# Patient Record
Sex: Female | Born: 1980 | Race: Black or African American | Hispanic: No | Marital: Single | Smoking: Never smoker
Health system: Southern US, Community
[De-identification: ages and names within clinical notes are randomized; demographics above are authoritative.]

## PROBLEM LIST (undated history)

## (undated) DIAGNOSIS — F329 Major depressive disorder, single episode, unspecified: Secondary | ICD-10-CM

## (undated) DIAGNOSIS — J45909 Unspecified asthma, uncomplicated: Secondary | ICD-10-CM

## (undated) DIAGNOSIS — F32A Depression, unspecified: Secondary | ICD-10-CM

## (undated) DIAGNOSIS — T7840XA Allergy, unspecified, initial encounter: Secondary | ICD-10-CM

## (undated) DIAGNOSIS — R519 Headache, unspecified: Secondary | ICD-10-CM

## (undated) DIAGNOSIS — R87629 Unspecified abnormal cytological findings in specimens from vagina: Secondary | ICD-10-CM

## (undated) HISTORY — DX: Headache, unspecified: R51.9

## (undated) HISTORY — DX: Depression, unspecified: F32.A

## (undated) HISTORY — PX: BUNIONECTOMY: SHX129

## (undated) HISTORY — DX: Unspecified abnormal cytological findings in specimens from vagina: R87.629

## (undated) HISTORY — DX: Allergy, unspecified, initial encounter: T78.40XA

---
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
## 2004-09-12 ENCOUNTER — Emergency Department (HOSPITAL_COMMUNITY): Admission: EM | Admit: 2004-09-12 | Discharge: 2004-09-12 | Payer: Self-pay | Admitting: Emergency Medicine
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
## 2009-05-13 ENCOUNTER — Ambulatory Visit: Payer: Self-pay | Admitting: Podiatry

## 2009-12-21 HISTORY — PX: COLPOSCOPY: SHX161

## 2012-12-01 ENCOUNTER — Ambulatory Visit: Payer: Self-pay | Admitting: Podiatry

## 2013-05-11 ENCOUNTER — Ambulatory Visit: Payer: Self-pay | Admitting: Podiatry

## 2013-06-28 LAB — LIPID PANEL
Cholesterol: 190 mg/dL (ref 0–200)
HDL: 62 mg/dL (ref 35–70)
LDL CALC: 108 mg/dL
Triglycerides: 101 mg/dL (ref 40–160)

## 2013-06-28 LAB — HM PAP SMEAR: HM Pap smear: NORMAL

## 2014-07-04 LAB — TSH: TSH: 2.07 u[IU]/mL (ref <=5.90)

## 2014-07-04 LAB — BASIC METABOLIC PANEL
BUN: 9 mg/dL (ref 4–21)
Creatinine: 0.8 mg/dL (ref <=1.1)

## 2015-05-27 DIAGNOSIS — F324 Major depressive disorder, single episode, in partial remission: Secondary | ICD-10-CM | POA: Insufficient documentation

## 2015-05-27 DIAGNOSIS — T7840XA Allergy, unspecified, initial encounter: Secondary | ICD-10-CM | POA: Insufficient documentation

## 2015-05-27 DIAGNOSIS — E559 Vitamin D deficiency, unspecified: Secondary | ICD-10-CM | POA: Insufficient documentation

## 2015-05-27 DIAGNOSIS — M62830 Muscle spasm of back: Secondary | ICD-10-CM | POA: Insufficient documentation

## 2015-05-27 DIAGNOSIS — Z8742 Personal history of other diseases of the female genital tract: Secondary | ICD-10-CM | POA: Insufficient documentation

## 2015-05-27 DIAGNOSIS — J3089 Other allergic rhinitis: Secondary | ICD-10-CM | POA: Insufficient documentation

## 2015-05-27 DIAGNOSIS — J4520 Mild intermittent asthma, uncomplicated: Secondary | ICD-10-CM | POA: Insufficient documentation

## 2015-05-27 DIAGNOSIS — T783XXA Angioneurotic edema, initial encounter: Secondary | ICD-10-CM | POA: Insufficient documentation

## 2015-08-06 ENCOUNTER — Ambulatory Visit (INDEPENDENT_AMBULATORY_CARE_PROVIDER_SITE_OTHER): Payer: Medicaid Other | Admitting: Family Medicine

## 2015-08-06 ENCOUNTER — Encounter: Payer: Self-pay | Admitting: Family Medicine

## 2015-08-06 VITALS — BP 110/70 | HR 70 | Ht 67.0 in | Wt 212.0 lb

## 2015-08-06 DIAGNOSIS — R319 Hematuria, unspecified: Secondary | ICD-10-CM

## 2015-08-06 DIAGNOSIS — L810 Postinflammatory hyperpigmentation: Secondary | ICD-10-CM

## 2015-08-06 LAB — POCT URINALYSIS DIPSTICK
Bilirubin, UA: NEGATIVE
Glucose, UA: NEGATIVE
Ketones, UA: NEGATIVE
NITRITE UA: NEGATIVE
PROTEIN UA: NEGATIVE
SPEC GRAV UA: 1.01
UROBILINOGEN UA: 0.2
pH, UA: 6

## 2015-08-06 MED ORDER — TRIAMCINOLONE ACETONIDE 0.5 % EX OINT: 1.0000 "application " | TOPICAL_OINTMENT | Freq: Two times a day (BID) | CUTANEOUS | Status: DC

## 2015-08-06 MED ORDER — SULFAMETHOXAZOLE-TRIMETHOPRIM 800-160 MG PO TABS: 1.0000 | ORAL_TABLET | Freq: Two times a day (BID) | ORAL | Status: DC

## 2015-08-06 NOTE — Progress Notes (Signed)
Name: Beverly Thomas   MRN: [id number]    DOB: 1981-11-07   Date:08/06/2015       Progress Note  Subjective  Chief Complaint  Chief Complaint  Patient presents with  . Rash    started off red and itchy- got darker but not itching anymore  . Vaginal Itching    bleeding when wipes- Diflucan x 2- tried otc     Rash This is a new problem. The current episode started in the past 7 days. The problem has been gradually worsening since onset. The affected locations include the neck. The rash is characterized by itchiness (hyperpigmented). It is unknown if there was an exposure to a precipitant. Pertinent negatives include no anorexia, congestion, cough, diarrhea, eye pain, facial edema, fatigue, fever, joint pain, nail changes, rhinorrhea, shortness of breath, sore throat or vomiting. Past treatments include topical steroids (antifungal). The treatment provided no relief. There is no history of allergies, asthma, eczema or varicella.  Vaginal Itching The patient's primary symptoms include genital itching and a genital rash. The patient's pertinent negatives include no genital lesions, genital odor, missed menses, pelvic pain, vaginal bleeding or vaginal discharge. This is a recurrent problem. The problem occurs daily. The pain is mild (irratated). Associated symptoms include hematuria and rash. Pertinent negatives include no abdominal pain, anorexia, back pain, chills, constipation, diarrhea, dysuria, fever, flank pain, frequency, headaches, joint pain, nausea, painful intercourse, sore throat, urgency or vomiting. The symptoms are aggravated by tactile pressure. She has tried antifungals for the symptoms. The treatment provided mild relief.    No problem-specific assessment & plan notes found for this encounter.   Past Medical History  Diagnosis Date  . Allergy     Past Surgical History  Procedure Laterality Date  . Bunionectomy Bilateral 2013, 2014  . Colposcopy  2011    Family History   Problem Relation Age of Onset  . Diabetes Mother   . Hypertension Mother     Social History   Social History  . Marital Status: Single    Spouse Name: N/A  . Number of Children: N/A  . Years of Education: N/A   Occupational History  . Not on file.   Social History Main Topics  . Smoking status: Never Smoker   . Smokeless tobacco: Not on file  . Alcohol Use: 0.0 oz/week    0 Standard drinks or equivalent per week     Comment: social  . Drug Use: No  . Sexual Activity: Not Currently   Other Topics Concern  . Not on file   Social History Narrative    Allergies  Allergen Reactions  . Diazepam     Other reaction(s): pruritis  . Metronidazole     Other reaction(s): Angioedema  . Zyrtec  [Cetirizine]     Other reaction(s): Hives  . Latex Rash     Review of Systems  Constitutional: Negative for fever, chills, weight loss, malaise/fatigue and fatigue.  HENT: Negative for congestion, ear discharge, ear pain, rhinorrhea and sore throat.   Eyes: Negative for blurred vision and pain.  Respiratory: Negative for cough, sputum production, shortness of breath and wheezing.   Cardiovascular: Negative for chest pain, palpitations and leg swelling.  Gastrointestinal: Negative for heartburn, nausea, vomiting, abdominal pain, diarrhea, constipation, blood in stool, melena and anorexia.  Genitourinary: Positive for hematuria. Negative for dysuria, urgency, frequency, flank pain, vaginal discharge, pelvic pain and missed menses.  Musculoskeletal: Negative for myalgias, back pain, joint pain and neck pain.  Skin: Positive  for itching and rash. Negative for nail changes.       Along skin neck hyperpigmentation  Neurological: Negative for dizziness, tingling, sensory change, focal weakness and headaches.  Endo/Heme/Allergies: Negative for environmental allergies and polydipsia. Does not bruise/bleed easily.  Psychiatric/Behavioral: Negative for depression and suicidal ideas. The  patient is not nervous/anxious and does not have insomnia.      Objective  Filed Vitals:   08/06/15 1514  BP: 110/70  Pulse: 70  Height: 5\' 7"  (1.702 m)  Weight: 212 lb (96.163 kg)    Physical Exam  Constitutional: She is well-developed, well-nourished, and in no distress. No distress.  HENT:  Head: Normocephalic and atraumatic.  Right Ear: External ear normal.  Left Ear: External ear normal.  Nose: Nose normal.  Mouth/Throat: Oropharynx is clear and moist.  Eyes: Conjunctivae and EOM are normal. Pupils are equal, round, and reactive to light. Right eye exhibits no discharge. Left eye exhibits no discharge.  Neck: Normal range of motion. Neck supple. No JVD present. No thyromegaly present.  Cardiovascular: Normal rate, regular rhythm, normal heart sounds and intact distal pulses.  Exam reveals no gallop and no friction rub.   No murmur heard. Pulmonary/Chest: Effort normal and breath sounds normal.  Abdominal: Soft. Bowel sounds are normal. She exhibits no mass. There is no tenderness. There is no guarding.  Musculoskeletal: Normal range of motion. She exhibits no edema.  Lymphadenopathy:    She has no cervical adenopathy.  Neurological: She is alert. She has normal reflexes.  Skin: Skin is warm and dry. Rash noted. She is not diaphoretic. No erythema. No pallor.  Hyperpigmented ares  Psychiatric: Mood and affect normal.      Assessment & Plan  Problem List Items Addressed This Visit    None    Visit Diagnoses    Hematuria    -  Primary    Relevant Medications    sulfamethoxazole-trimethoprim (BACTRIM DS,SEPTRA DS) 800-160 MG per tablet    Other Relevant Orders    POCT Urinalysis Dipstick (Completed)    Post-inflammatory hyperpigmentation        Relevant Medications    triamcinolone ointment (KENALOG) 0.5 %    Other Relevant Orders    Ambulatory referral to Dermatology         Dr. Elizabeth Sauer [facility]  08/06/2015

## 2015-08-08 DIAGNOSIS — J40 Bronchitis, not specified as acute or chronic: Secondary | ICD-10-CM | POA: Insufficient documentation

## 2015-08-13 ENCOUNTER — Other Ambulatory Visit: Payer: Self-pay | Admitting: Internal Medicine

## 2015-08-13 MED ORDER — CLINDAMYCIN HCL 300 MG PO CAPS: 300.0000 mg | ORAL_CAPSULE | Freq: Two times a day (BID) | ORAL | Status: DC

## 2015-09-16 ENCOUNTER — Other Ambulatory Visit: Payer: Self-pay | Admitting: Internal Medicine

## 2015-10-02 ENCOUNTER — Encounter: Payer: Self-pay | Admitting: Internal Medicine

## 2015-10-16 ENCOUNTER — Encounter: Payer: Self-pay | Admitting: Internal Medicine

## 2015-10-16 ENCOUNTER — Ambulatory Visit (INDEPENDENT_AMBULATORY_CARE_PROVIDER_SITE_OTHER): Payer: Medicaid Other | Admitting: Internal Medicine

## 2015-10-16 VITALS — HR 76 | Ht 67.0 in | Wt 212.0 lb

## 2015-10-16 DIAGNOSIS — N760 Acute vaginitis: Secondary | ICD-10-CM

## 2015-10-16 LAB — POCT WET PREP WITH KOH
KOH PREP POC: NEGATIVE
RBC Wet Prep HPF POC: 2
TRICHOMONAS UA: POSITIVE
WBC WET PREP PER HPF POC: 5
Yeast Wet Prep HPF POC: 0

## 2015-10-16 MED ORDER — TINIDAZOLE 500 MG PO TABS: 2.0000 g | ORAL_TABLET | Freq: Every day | ORAL | Status: DC

## 2015-10-16 MED ORDER — CLINDAMYCIN PHOSPHATE (1 DOSE) 2 % VA CREA: 1.0000 | TOPICAL_CREAM | Freq: Every day | VAGINAL | Status: DC

## 2015-10-16 NOTE — Progress Notes (Signed)
Date:  10/16/2015   Name:  Beverly Thomas   DOB:  08/23/1981   MRN:  [id number]   Chief Complaint: Vaginal Discharge  Patient complains of vaginal discharge with some odor and slight itching. She denies any bleeding pelvic pain or stomach cramping. No fever or chills. No change in personal care products. No new soaps or bath products. She was seen several months ago with a vaginitis that resolved after treatment.   Review of Systems  Constitutional: Negative for fever and diaphoresis.  Respiratory: Negative for shortness of breath.   Cardiovascular: Negative for chest pain.  Gastrointestinal: Negative for abdominal pain and diarrhea.  Genitourinary: Positive for vaginal discharge (and odor). Negative for frequency, hematuria, flank pain and pelvic pain.    Patient Active Problem List   Diagnosis Date Noted  . Bronchitis 08/08/2015  . H/O abnormal cervical Papanicolaou smear 05/27/2015  . Angio-edema 05/27/2015  . Allergic state 05/27/2015  . 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 Asthma, mild intermittent 05/27/2015  . Back muscle spasm 05/27/2015  . Avitaminosis D 05/27/2015    Prior to Admission medications   Medication Sig Start Date End Date Taking? Authorizing Provider  cyclobenzaprine (FLEXERIL) 10 MG tablet TAKE 1/2 TO 1 TABLET TWICE A DAY 09/16/15  Yes Reubin MilanLaura H Harla Mensch, MD  DULoxetine (CYMBALTA) 30 MG capsule Take 30 mg by mouth daily.   Yes Historical Provider, MD  fluticasone (FLONASE) 50 MCG/ACT nasal spray Place 2 sprays into the nose daily. 04/13/14  Yes Historical Provider, MD  levonorgestrel (MIRENA) 20 MCG/24HR IUD by Intrauterine route.   Yes Historical Provider, MD  loratadine (CLARITIN) 10 MG tablet Take 1 tablet by mouth daily. 08/22/14  Yes Historical Provider, MD  triamcinolone ointment (KENALOG) 0.5 % Apply 1 application topically 2 (two) times daily. 08/06/15  Yes Duanne Limerickeanna C Jones, MD    Allergies  Allergen Reactions  .  Diazepam     Other reaction(s): pruritis  . Metronidazole     Other reaction(s): Angioedema  . Sulfa Antibiotics Hives  . Zyrtec  [Cetirizine]     Other reaction(s): Hives  . Latex Rash    Past Surgical History  Procedure Laterality Date  . Bunionectomy Bilateral 2013, 2014  . Colposcopy  2011    Social History  Substance Use Topics  . Smoking status: Never Smoker   . Smokeless tobacco: None  . Alcohol Use: 0.0 oz/week    0 Standard drinks or equivalent per week     Comment: social     Medication list has been reviewed and updated.   Physical Exam  Constitutional: She is oriented to person, place, and time. She appears well-developed. No distress.  HENT:  Head: Normocephalic and atraumatic.  Eyes: Conjunctivae are normal. Right eye exhibits no discharge. Left eye exhibits no discharge. No scleral icterus.  Cardiovascular: Normal rate, regular rhythm and normal heart sounds.   Pulmonary/Chest: Effort normal and breath sounds normal. No respiratory distress.  Abdominal: Soft. Normal appearance and bowel sounds are normal. There is no CVA tenderness.  Genitourinary: There is no tenderness or lesion on the right labia. There is no tenderness or lesion on the left labia. No erythema, tenderness or bleeding in the vagina. Vaginal discharge found.  Musculoskeletal: Normal range of motion.  Neurological: She is alert and oriented to person, place, and time.  Skin: Skin is warm and dry. No rash noted.  Psychiatric: She has a normal mood and affect. Her behavior is normal. Thought content  normal.  Nursing note and vitals reviewed.   Pulse 76  Ht  (1.702 m)  Wt 212 lb (96.163 kg)  BMI 33.20 kg/m2  Assessment and Plan: 1. Vaginitis Patient has both bacterial vaginosis and Trichomonas She is allergic to metronidazole so we will need to use an alternative oral agent as well as vaginal cream Sexual partner should also seek treatment - tinidazole (TINDAMAX) 500 MG tablet;  Take 4 tablets (2,000 mg total) by mouth daily with breakfast.  Dispense: 4 tablet; Refill: 0 - Clindamycin Phosphate, 1 Dose, vaginal cream; Apply 1 Applicatorful topically at bedtime.  Dispense: 5.8 g; Refill: 0 - POCT Wet Prep with KOH   Bari Edward, MD [facility]  10/16/2015

## 2015-10-22 ENCOUNTER — Other Ambulatory Visit: Payer: Self-pay | Admitting: Internal Medicine

## 2015-12-02 ENCOUNTER — Ambulatory Visit (INDEPENDENT_AMBULATORY_CARE_PROVIDER_SITE_OTHER): Payer: Medicaid Other | Admitting: Internal Medicine

## 2015-12-02 ENCOUNTER — Encounter: Payer: Self-pay | Admitting: Internal Medicine

## 2015-12-02 VITALS — BP 110/80 | HR 88 | Temp 99.0°F | Ht 67.0 in | Wt 211.4 lb

## 2015-12-02 DIAGNOSIS — J0100 Acute maxillary sinusitis, unspecified: Secondary | ICD-10-CM

## 2015-12-02 DIAGNOSIS — J40 Bronchitis, not specified as acute or chronic: Secondary | ICD-10-CM

## 2015-12-02 MED ORDER — AMOXICILLIN-POT CLAVULANATE 875-125 MG PO TABS: 1.0000 | ORAL_TABLET | Freq: Two times a day (BID) | ORAL | Status: DC

## 2015-12-02 NOTE — Progress Notes (Signed)
Date:  12/02/2015   Name:  Beverly Thomas   DOB:  March 17, 1981   MRN:  [id number]   Chief Complaint: Sinusitis Sinusitis This is a new problem. The current episode started in the past 7 days. The problem has been gradually worsening since onset. There has been no fever. Associated symptoms include congestion, coughing, shortness of breath and a sore throat. Pertinent negatives include no chills or sinus pressure. Past treatments include oral decongestants. The treatment provided mild relief.     Review of Systems  Constitutional: Negative for fever, chills and fatigue.  HENT: Positive for congestion and sore throat. Negative for postnasal drip and sinus pressure.   Respiratory: Positive for cough, chest tightness, shortness of breath and wheezing.   Cardiovascular: Negative for chest pain, palpitations and leg swelling.  Neurological: Negative for dizziness and light-headedness.    Patient Active Problem List   Diagnosis Date Noted  . Bronchitis 08/08/2015  . H/O abnormal cervical Papanicolaou smear 05/27/2015  . Angio-edema 05/27/2015  . Allergic state 05/27/2015  . 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 Asthma, mild intermittent 05/27/2015  . Back muscle spasm 05/27/2015  . Avitaminosis D 05/27/2015    Prior to Admission medications   Medication Sig Start Date End Date Taking? Authorizing Provider  cyclobenzaprine (FLEXERIL) 10 MG tablet TAKE 1/2 TO 1 TABLET TWICE A DAY 10/22/15  Yes Reubin Milan, MD  DULoxetine (CYMBALTA) 30 MG capsule Take 30 mg by mouth daily.   Yes Historical Provider, MD  fluticasone (FLONASE) 50 MCG/ACT nasal spray Place 2 sprays into the nose daily. 04/13/14  Yes Historical Provider, MD  levonorgestrel (MIRENA) 20 MCG/24HR IUD by Intrauterine route.   Yes Historical Provider, MD  loratadine (CLARITIN) 10 MG tablet Take 1 tablet by mouth daily. 08/22/14  Yes Historical Provider, MD  triamcinolone ointment (KENALOG) 0.5 %  Apply 1 application topically 2 (two) times daily. 08/06/15  Yes Duanne Limerick, MD    Allergies  Allergen Reactions  . Diazepam     Other reaction(s): pruritis  . Metronidazole     Other reaction(s): Angioedema  . Sulfa Antibiotics Hives  . Zyrtec  [Cetirizine]     Other reaction(s): Hives  . Latex Rash    Past Surgical History  Procedure Laterality Date  . Bunionectomy Bilateral 2013, 2014  . Colposcopy  2011    Social History  Substance Use Topics  . Smoking status: Never Smoker   . Smokeless tobacco: None  . Alcohol Use: 0.0 oz/week    0 Standard drinks or equivalent per week     Comment: social    Medication list has been reviewed and updated.   Physical Exam  Constitutional: She is oriented to person, place, and time. She appears well-developed and well-nourished.  HENT:  Right Ear: External ear and ear canal normal. Tympanic membrane is not erythematous and not retracted.  Left Ear: External ear and ear canal normal. Tympanic membrane is not erythematous and not retracted.  Nose: Right sinus exhibits maxillary sinus tenderness and frontal sinus tenderness. Left sinus exhibits maxillary sinus tenderness and frontal sinus tenderness.  Mouth/Throat: Uvula is midline and mucous membranes are normal. No oral lesions. Posterior oropharyngeal erythema present. No oropharyngeal exudate.  Cardiovascular: Normal rate, regular rhythm and normal heart sounds.   Pulmonary/Chest: Effort normal. She has wheezes. She has no rales.  Lymphadenopathy:    She has no cervical adenopathy.  Neurological: She is alert and oriented to person, place, and time.  Nursing note and vitals reviewed.   BP 110/80 mmHg  Pulse 88  Temp(Src) 99 F (37.2 C)  Ht 5\' 7"  (1.702 m)  Wt 211 lb 6.4 oz (95.89 kg)  BMI 33.10 kg/m2  SpO2 99%  Assessment and Plan: 1. Bronchitis Continue over-the-counter cough syrup Use albuterol inhaler as needed for wheezing - amoxicillin-clavulanate (AUGMENTIN)  875-125 MG tablet; Take 1 tablet by mouth 2 (two) times daily.  Dispense: 20 tablet; Refill: 0  2. Acute maxillary sinusitis, recurrence not specified - amoxicillin-clavulanate (AUGMENTIN) 875-125 MG tablet; Take 1 tablet by mouth 2 (two) times daily.  Dispense: 20 tablet; Refill: 0   Bari EdwardLaura Berglund, MD [facility]  12/02/2015

## 2015-12-12 ENCOUNTER — Telehealth: Payer: Self-pay

## 2015-12-12 ENCOUNTER — Other Ambulatory Visit: Payer: Self-pay | Admitting: Internal Medicine

## 2015-12-12 MED ORDER — FLUCONAZOLE 100 MG PO TABS
100.0000 mg | ORAL_TABLET | Freq: Every day | ORAL | Status: DC
Start: 2015-12-12 — End: 2015-12-18

## 2015-12-12 NOTE — Telephone Encounter (Signed)
Rx sent to pharmacy   

## 2015-12-12 NOTE — Telephone Encounter (Signed)
Patient now has yeast from antibiotic you gave her, requesting Diflucan to pharmacy.dr

## 2015-12-13 ENCOUNTER — Other Ambulatory Visit: Payer: Self-pay | Admitting: Internal Medicine

## 2015-12-18 ENCOUNTER — Encounter: Payer: Self-pay | Admitting: Internal Medicine

## 2015-12-18 ENCOUNTER — Ambulatory Visit (INDEPENDENT_AMBULATORY_CARE_PROVIDER_SITE_OTHER): Payer: Medicaid Other | Admitting: Internal Medicine

## 2015-12-18 VITALS — BP 116/68 | HR 104 | Temp 99.0°F | Ht 67.5 in | Wt 217.0 lb

## 2015-12-18 DIAGNOSIS — J40 Bronchitis, not specified as acute or chronic: Secondary | ICD-10-CM | POA: Diagnosis not present

## 2015-12-18 MED ORDER — DOXYCYCLINE HYCLATE 100 MG PO TABS: 100.0000 mg | ORAL_TABLET | Freq: Two times a day (BID) | ORAL | Status: DC

## 2015-12-18 MED ORDER — METHYLPREDNISOLONE 4 MG PO TBPK: ORAL_TABLET | ORAL | Status: DC

## 2015-12-18 MED ORDER — BENZONATATE 200 MG PO CAPS: 200.0000 mg | ORAL_CAPSULE | Freq: Two times a day (BID) | ORAL | Status: DC | PRN

## 2015-12-18 NOTE — Progress Notes (Signed)
Date:  12/18/2015   Name:  Beverly Thomas   DOB:  03/27/1981   MRN:  [id number]   Chief Complaint: Bronchitis Cough This is a recurrent problem. The current episode started in the past 7 days. The problem has been gradually worsening. The problem occurs every few minutes. Associated symptoms include shortness of breath and wheezing. Pertinent negatives include no chest pain, chills, fever or sore throat.   She was treated for bronchitis about 3 weeks ago with Augmentin. She began to feel better but then several days ago cough started up again. She's had wheezing and shortness of breath, using her pro-air inhaler every 3 hours. As a result she is having a racing heart as well.     Review of Systems  Constitutional: Positive for fatigue. Negative for fever and chills.  HENT: Negative for congestion and sore throat.   Respiratory: Positive for cough, chest tightness, shortness of breath and wheezing.   Cardiovascular: Negative for chest pain, palpitations and leg swelling.  Neurological: Negative for dizziness and syncope.    Patient Active Problem List   Diagnosis Date Noted  . Bronchitis 08/08/2015  . H/O abnormal cervical Papanicolaou smear 05/27/2015  . Angio-edema 05/27/2015  . Allergic state 05/27/2015  . 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 Asthma, mild intermittent 05/27/2015  . Back muscle spasm 05/27/2015  . Avitaminosis D 05/27/2015    Prior to Admission medications   Medication Sig Start Date End Date Taking? Authorizing Provider  cyclobenzaprine (FLEXERIL) 10 MG tablet TAKE 1/2 TO 1 TABLET TWICE A DAY 10/22/15  Yes Reubin MilanLaura H Santiago Graf, MD  DULoxetine (CYMBALTA) 30 MG capsule Take 30 mg by mouth daily.   Yes Historical Provider, MD  fluticasone (FLONASE) 50 MCG/ACT nasal spray Place 2 sprays into the nose daily. 04/13/14  Yes Historical Provider, MD  levonorgestrel (MIRENA) 20 MCG/24HR IUD by Intrauterine route.   Yes Historical  Provider, MD  loratadine (CLARITIN) 10 MG tablet Take 1 tablet by mouth daily. 08/22/14  Yes Historical Provider, MD  triamcinolone ointment (KENALOG) 0.5 % Apply 1 application topically 2 (two) times daily. 08/06/15  Yes Duanne Limerickeanna C Jones, MD    Allergies  Allergen Reactions  . Diazepam     Other reaction(s): pruritis  . Diflucan [Fluconazole]   . Metronidazole     Other reaction(s): Angioedema  . Sulfa Antibiotics Hives  . Zyrtec  [Cetirizine]     Other reaction(s): Hives  . Latex Rash    Past Surgical History  Procedure Laterality Date  . Bunionectomy Bilateral 2013, 2014  . Colposcopy  2011    Social History  Substance Use Topics  . Smoking status: Never Smoker   . Smokeless tobacco: None  . Alcohol Use: 0.0 oz/week    0 Standard drinks or equivalent per week     Comment: social     Medication list has been reviewed and updated.   Physical Exam  Constitutional: She appears well-developed and well-nourished.  Neck: Normal range of motion. Neck supple. No thyromegaly present.  Cardiovascular: Normal rate, regular rhythm and normal heart sounds.   Pulmonary/Chest: Effort normal. No accessory muscle usage. She has decreased breath sounds. She has wheezes. She has no rhonchi.  Lymphadenopathy:    She has no cervical adenopathy.  Psychiatric: She has a normal mood and affect.  Nursing note and vitals reviewed.   BP 116/68 mmHg  Pulse 104  Temp(Src) 99 F (37.2 C)  Ht 5' 7.5" (1.715 m)  Wt 217 lb (98.431 kg)  BMI 33.47 kg/m2  SpO2 98%  Assessment and Plan: 1. Bronchitis Advair 250/50 1 inhalation daily (samples given) Use pro-air inhaler every 4-6 hours as needed Now allergic to fluconazole - recommend probiotics while taking antibiotics - doxycycline (VIBRA-TABS) 100 MG tablet; Take 1 tablet (100 mg total) by mouth 2 (two) times daily.  Dispense: 20 tablet; Refill: 0 - methylPREDNISolone (MEDROL DOSEPAK) 4 MG TBPK tablet; Take 6 pills on day 1 the 5 pills day 2  then 4 pills day 3 then 3 pills day 4 then 2 pills day 5 then one pills day 6 then stop  Dispense: 21 tablet; Refill: 0 - benzonatate (TESSALON) 200 MG capsule; Take 1 capsule (200 mg total) by mouth 2 (two) times daily as needed for cough.  Dispense: 30 capsule; Refill: 0   Bari Edward, MD [facility]  12/18/2015

## 2016-01-14 ENCOUNTER — Telehealth: Payer: Self-pay

## 2016-01-14 DIAGNOSIS — M62830 Muscle spasm of back: Secondary | ICD-10-CM

## 2016-01-14 NOTE — Telephone Encounter (Signed)
Patient called in asking for a another referral to [facility].

## 2016-02-03 ENCOUNTER — Ambulatory Visit (INDEPENDENT_AMBULATORY_CARE_PROVIDER_SITE_OTHER): Payer: Medicaid Other | Admitting: Internal Medicine

## 2016-02-03 ENCOUNTER — Encounter: Payer: Self-pay | Admitting: Internal Medicine

## 2016-02-03 VITALS — BP 120/76 | HR 82 | Temp 98.3°F | Ht 67.5 in | Wt 213.6 lb

## 2016-02-03 DIAGNOSIS — F324 Major depressive disorder, single episode, in partial remission: Secondary | ICD-10-CM | POA: Diagnosis not present

## 2016-02-03 DIAGNOSIS — J40 Bronchitis, not specified as acute or chronic: Secondary | ICD-10-CM

## 2016-02-03 DIAGNOSIS — J4520 Mild intermittent asthma, uncomplicated: Secondary | ICD-10-CM | POA: Diagnosis not present

## 2016-02-03 MED ORDER — ALBUTEROL SULFATE HFA 108 (90 BASE) MCG/ACT IN AERS: 2.0000 | INHALATION_SPRAY | Freq: Four times a day (QID) | RESPIRATORY_TRACT | Status: DC | PRN

## 2016-02-03 MED ORDER — AMOXICILLIN 875 MG PO TABS: 875.0000 mg | ORAL_TABLET | Freq: Two times a day (BID) | ORAL | Status: DC

## 2016-02-03 MED ORDER — LORATADINE 10 MG PO TABS: 10.0000 mg | ORAL_TABLET | Freq: Every day | ORAL | Status: DC

## 2016-02-03 MED ORDER — DULOXETINE HCL 30 MG PO CPEP: 30.0000 mg | ORAL_CAPSULE | Freq: Every day | ORAL | Status: DC

## 2016-02-03 MED ORDER — FLUTICASONE-SALMETEROL 100-50 MCG/DOSE IN AEPB: 1.0000 | INHALATION_SPRAY | Freq: Two times a day (BID) | RESPIRATORY_TRACT | Status: DC

## 2016-02-03 NOTE — Progress Notes (Signed)
Date:  02/03/2016   Name:  Beverly Thomas   DOB:  10-25-1981   MRN:  [id number]   Chief Complaint: Bronchitis Cough This is a new problem. The current episode started yesterday. The problem has been gradually worsening. The problem occurs every few minutes. The cough is non-productive. Associated symptoms include shortness of breath and wheezing. Pertinent negatives include no chest pain, chills, fever, headaches or sore throat. Her past medical history is significant for asthma.  Asthma She complains of cough, shortness of breath and wheezing. This is a recurrent problem. The problem occurs every several days. The problem has been unchanged. Pertinent negatives include no chest pain, fever, headaches, sore throat or trouble swallowing. Her past medical history is significant for asthma.  Depression        This is a chronic (she continues to see a therapist but asks if I can refill her medication) problem.  The current episode started more than 1 year ago.   The problem occurs rarely.  The problem has been resolved since onset.  Associated symptoms include fatigue.  Associated symptoms include no headaches.  Past treatments include SNRIs - Serotonin and norepinephrine reuptake inhibitors.  Compliance with treatment is good.    Review of Systems  Constitutional: Positive for fatigue. Negative for fever and chills.  HENT: Negative for sore throat and trouble swallowing.   Respiratory: Positive for cough, shortness of breath and wheezing. Negative for chest tightness.   Cardiovascular: Negative for chest pain, palpitations and leg swelling.  Gastrointestinal: Negative for nausea, abdominal pain, diarrhea and blood in stool.  Neurological: Negative for dizziness and headaches.  Psychiatric/Behavioral: Positive for depression. Negative for dysphoric mood. The patient is not nervous/anxious.     Patient Active Problem List   Diagnosis Date Noted  . Bronchitis 08/08/2015  . H/O abnormal  cervical Papanicolaou smear 05/27/2015  . Angio-edema 05/27/2015  . Allergic state 05/27/2015  . 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 Asthma, mild intermittent 05/27/2015  . Back muscle spasm 05/27/2015  . Avitaminosis D 05/27/2015    Prior to Admission medications   Medication Sig Start Date End Date Taking? Authorizing Provider  b complex vitamins tablet Take 1 tablet by mouth daily.   Yes Historical Provider, MD  Calcium Carb-Cholecalciferol (CALCIUM 1000 + D PO) Take by mouth.   Yes Historical Provider, MD  Cholecalciferol (VITAMIN D3) 5000 units CAPS Take by mouth.   Yes Historical Provider, MD  cyclobenzaprine (FLEXERIL) 10 MG tablet TAKE 1/2 TO 1 TABLET TWICE A DAY 10/22/15  Yes Reubin Milan, MD  DULoxetine (CYMBALTA) 30 MG capsule Take 30 mg by mouth daily.   Yes Historical Provider, MD  fluticasone (FLONASE) 50 MCG/ACT nasal spray Place 2 sprays into the nose daily. 04/13/14  Yes Historical Provider, MD  levonorgestrel (MIRENA) 20 MCG/24HR IUD by Intrauterine route.   Yes Historical Provider, MD  loratadine (CLARITIN) 10 MG tablet Take 1 tablet by mouth daily. 08/22/14  Yes Historical Provider, MD  Probiotic Product (PROBIOTIC ADVANCED PO) Take by mouth.   Yes Historical Provider, MD  triamcinolone ointment (KENALOG) 0.5 % Apply 1 application topically 2 (two) times daily. 08/06/15  Yes Duanne Limerick, MD    Allergies  Allergen Reactions  . Diazepam     Other reaction(s): pruritis  . Diflucan [Fluconazole]   . Metronidazole     Other reaction(s): Angioedema  . Sulfa Antibiotics Hives  . Zyrtec  [Cetirizine]     Other  reaction(s): Hives  . Latex Rash    Past Surgical History  Procedure Laterality Date  . Bunionectomy Bilateral 2013, 2014  . Colposcopy  2011    Social History  Substance Use Topics  . Smoking status: Never Smoker   . Smokeless tobacco: None  . Alcohol Use: 0.0 oz/week    0 Standard drinks or equivalent per  week     Comment: social     Medication list has been reviewed and updated.   Physical Exam  Constitutional: She appears well-developed and well-nourished.  HENT:  Right Ear: Tympanic membrane and ear canal normal.  Left Ear: Tympanic membrane and ear canal normal.  Nose: Right sinus exhibits no maxillary sinus tenderness. Left sinus exhibits no maxillary sinus tenderness.  Mouth/Throat: Oropharynx is clear and moist.  Neck: Normal range of motion. Neck supple.  Cardiovascular: Normal rate, regular rhythm and normal heart sounds.   Pulmonary/Chest: Effort normal. No respiratory distress. She has wheezes. She has no rales.  Musculoskeletal: She exhibits no edema or tenderness.  Lymphadenopathy:    She has no cervical adenopathy.  Nursing note and vitals reviewed.   BP 120/76 mmHg  Pulse 82  Temp(Src) 98.3 F (36.8 C) (Oral)  Ht 5' 7.5" (1.715 m)  Wt 213 lb 9.6 oz (96.888 kg)  BMI 32.94 kg/m2  SpO2 100%  Assessment and Plan: 1. Bronchitis - amoxicillin (AMOXIL) 875 MG tablet; Take 1 tablet (875 mg total) by mouth 2 (two) times daily.  Dispense: 20 tablet; Refill: 0  2. Asthma, mild intermittent, uncomplicated Need to start daily therapy for frequent recurrences - Fluticasone-Salmeterol (ADVAIR) 100-50 MCG/DOSE AEPB; Inhale 1 puff into the lungs 2 (two) times daily.  Dispense: 1 each; Refill: 5  3. Depression, major, single episode, in partial remission (HCC) Doing well - continue with counselling Follow up with Psychiatrist if needed - DULoxetine (CYMBALTA) 30 MG capsule; Take 1 capsule (30 mg total) by mouth daily.  Dispense: 30 capsule; Refill: 5   Bari Edward, MD [facility]  02/03/2016

## 2016-05-07 ENCOUNTER — Ambulatory Visit (INDEPENDENT_AMBULATORY_CARE_PROVIDER_SITE_OTHER): Payer: Medicaid Other | Admitting: Internal Medicine

## 2016-05-07 ENCOUNTER — Encounter: Payer: Self-pay | Admitting: Internal Medicine

## 2016-05-07 VITALS — BP 120/74 | HR 80 | Ht 67.0 in | Wt 212.0 lb

## 2016-05-07 DIAGNOSIS — S96912A Strain of unspecified muscle and tendon at ankle and foot level, left foot, initial encounter: Secondary | ICD-10-CM

## 2016-05-07 DIAGNOSIS — M67472 Ganglion, left ankle and foot: Secondary | ICD-10-CM

## 2016-05-07 NOTE — Progress Notes (Signed)
Date:  05/07/2016   Name:  Beverly Thomas   DOB:  1981/05/07   MRN:  [id number]   Chief Complaint: Ankle Pain HPIKnot on foot - had a fall x 1 month ago- hit top of L) foot on the bottom of the step. Had a knot come up yesterday on top of foot.  She also has some ankle discomfort on the left lateral ankle.  There has been some mild swelling.   Review of Systems  Constitutional: Negative for chills and fatigue.  Respiratory: Negative for shortness of breath.   Cardiovascular: Negative for chest pain.  Musculoskeletal: Positive for joint swelling and arthralgias.    Patient Active Problem List   Diagnosis Date Noted  . Bronchitis 08/08/2015  . H/O abnormal cervical Papanicolaou smear 05/27/2015  . Angio-edema 05/27/2015  . Allergic state 05/27/2015  . 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 Asthma, mild intermittent 05/27/2015  . Back muscle spasm 05/27/2015  . Avitaminosis D 05/27/2015    Prior to Admission medications   Medication Sig Start Date End Date Taking? Authorizing Provider  albuterol (PROVENTIL HFA;VENTOLIN HFA) 108 (90 Base) MCG/ACT inhaler Inhale 2 puffs into the lungs every 6 (six) hours as needed for wheezing or shortness of breath. 02/03/16   Reubin Milan, MD  amoxicillin (AMOXIL) 875 MG tablet Take 1 tablet (875 mg total) by mouth 2 (two) times daily. 02/03/16   Reubin Milan, MD  b complex vitamins tablet Take 1 tablet by mouth daily.    Historical Provider, MD  Calcium Carb-Cholecalciferol (CALCIUM 1000 + D PO) Take by mouth.    Historical Provider, MD  Cholecalciferol (VITAMIN D3) 5000 units CAPS Take by mouth.    Historical Provider, MD  cyclobenzaprine (FLEXERIL) 10 MG tablet TAKE 1/2 TO 1 TABLET TWICE A DAY 10/22/15   Reubin Milan, MD  DULoxetine (CYMBALTA) 30 MG capsule Take 1 capsule (30 mg total) by mouth daily. 02/03/16   Reubin Milan, MD  fluticasone (FLONASE) 50 MCG/ACT nasal spray Place 2 sprays into  the nose daily. 04/13/14   Historical Provider, MD  Fluticasone-Salmeterol (ADVAIR) 100-50 MCG/DOSE AEPB Inhale 1 puff into the lungs 2 (two) times daily. 02/03/16   Reubin Milan, MD  levonorgestrel (MIRENA) 20 MCG/24HR IUD by Intrauterine route.    Historical Provider, MD  loratadine (CLARITIN) 10 MG tablet Take 1 tablet (10 mg total) by mouth daily. 02/03/16   Reubin Milan, MD  Probiotic Product (PROBIOTIC ADVANCED PO) Take by mouth.    Historical Provider, MD  triamcinolone ointment (KENALOG) 0.5 % Apply 1 application topically 2 (two) times daily. 08/06/15   Duanne Limerick, MD    Allergies  Allergen Reactions  . Diazepam     Other reaction(s): pruritis  . Diflucan [Fluconazole]   . Metronidazole     Other reaction(s): Angioedema  . Sulfa Antibiotics Hives  . Zyrtec  [Cetirizine]     Other reaction(s): Hives  . Latex Rash    Past Surgical History  Procedure Laterality Date  . Bunionectomy Bilateral 2013, 2014  . Colposcopy  2011    Social History  Substance Use Topics  . Smoking status: Never Smoker   . Smokeless tobacco: None  . Alcohol Use: 0.0 oz/week    0 Standard drinks or equivalent per week     Comment: social     Medication list has been reviewed and updated.   Physical Exam  Constitutional: She is oriented to  person, place, and time. She appears well-developed. No distress.  HENT:  Head: Normocephalic and atraumatic.  Cardiovascular: Normal rate, regular rhythm and normal heart sounds.   Pulmonary/Chest: Effort normal and breath sounds normal. No respiratory distress.  Musculoskeletal:       Left ankle: She exhibits swelling. She exhibits normal range of motion, no deformity and normal pulse. Tenderness.  Ganglion on mid left dorsal foot.  Tender but soft.  Not consistent with prior hardware.   Neurological: She is alert and oriented to person, place, and time.  Skin: Skin is warm and dry. No rash noted.  Psychiatric: She has a normal mood and  affect. Her behavior is normal. Thought content normal.  Nursing note and vitals reviewed.   BP 120/74 mmHg  Pulse 80  Ht 5\' 7"  (1.702 m)  Wt 212 lb (96.163 kg)  BMI 33.20 kg/m2  Assessment and Plan: 1. Ankle strain, left, initial encounter Elevate and use ibuprofen  2. Ganglion cyst of left foot Elevate and use ice - should resolve or become minimally problematic   Bari EdwardLaura Abdul Beirne, MD [facility]  05/07/2016

## 2016-06-01 ENCOUNTER — Encounter: Payer: Self-pay | Admitting: Internal Medicine

## 2016-06-01 ENCOUNTER — Ambulatory Visit (INDEPENDENT_AMBULATORY_CARE_PROVIDER_SITE_OTHER): Payer: Medicaid Other | Admitting: Internal Medicine

## 2016-06-01 VITALS — BP 116/78 | HR 82 | Resp 16 | Ht 67.0 in | Wt 215.0 lb

## 2016-06-01 DIAGNOSIS — M67472 Ganglion, left ankle and foot: Secondary | ICD-10-CM | POA: Diagnosis not present

## 2016-06-01 DIAGNOSIS — F324 Major depressive disorder, single episode, in partial remission: Secondary | ICD-10-CM

## 2016-06-01 MED ORDER — DULOXETINE HCL 60 MG PO CPEP: 60.0000 mg | ORAL_CAPSULE | Freq: Every day | ORAL | Status: DC

## 2016-06-01 NOTE — Progress Notes (Signed)
Date:  06/01/2016   Name:  Beverly Thomas   DOB:  1981-02-25   MRN:  [id number]   Chief Complaint: Cyst Anxiety Presents for follow-up visit. Symptoms include depressed mood, excessive worry and irritability. Patient reports no chest pain or shortness of breath. Symptoms occur most days.     Patient was seen last month after she twisted her left ankle and foot. She noticed a knot on the top of her foot that was felt to be a ganglion cyst. The cyst is persistent.  The top of her foot is still achy and tender.   Review of Systems  Constitutional: Positive for irritability. Negative for chills and fatigue.  Respiratory: Negative for shortness of breath.   Cardiovascular: Negative for chest pain.  Musculoskeletal: Positive for joint swelling (top of foot).    Patient Active Problem List   Diagnosis Date Noted  . Bronchitis 08/08/2015  . H/O abnormal cervical Papanicolaou smear 05/27/2015  . Angio-edema 05/27/2015  . Allergic state 05/27/2015  . 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 Asthma, mild intermittent 05/27/2015  . Back muscle spasm 05/27/2015  . Avitaminosis D 05/27/2015    Prior to Admission medications   Medication Sig Start Date End Date Taking? Authorizing Provider  albuterol (PROVENTIL HFA;VENTOLIN HFA) 108 (90 Base) MCG/ACT inhaler Inhale 2 puffs into the lungs every 6 (six) hours as needed for wheezing or shortness of breath. 02/03/16  Yes Reubin Milan, MD  b complex vitamins tablet Take 1 tablet by mouth daily.   Yes Historical Provider, MD  Calcium Carb-Cholecalciferol (CALCIUM 1000 + D PO) Take by mouth.   Yes Historical Provider, MD  Cholecalciferol (VITAMIN D3) 5000 units CAPS Take by mouth.   Yes Historical Provider, MD  cyclobenzaprine (FLEXERIL) 10 MG tablet TAKE 1/2 TO 1 TABLET TWICE A DAY 10/22/15  Yes Reubin Milan, MD  DULoxetine (CYMBALTA) 30 MG capsule Take 1 capsule (30 mg total) by mouth daily. 02/03/16  Yes  Reubin Milan, MD  fluticasone (FLONASE) 50 MCG/ACT nasal spray Place 2 sprays into the nose daily. 04/13/14  Yes Historical Provider, MD  Fluticasone-Salmeterol (ADVAIR) 100-50 MCG/DOSE AEPB Inhale 1 puff into the lungs 2 (two) times daily. 02/03/16  Yes Reubin Milan, MD  levonorgestrel (MIRENA) 20 MCG/24HR IUD by Intrauterine route.   Yes Historical Provider, MD  loratadine (CLARITIN) 10 MG tablet Take 1 tablet (10 mg total) by mouth daily. 02/03/16  Yes Reubin Milan, MD  Probiotic Product (PROBIOTIC ADVANCED PO) Take by mouth.   Yes Historical Provider, MD  triamcinolone ointment (KENALOG) 0.5 % Apply 1 application topically 2 (two) times daily. 08/06/15  Yes Duanne Limerick, MD    Allergies  Allergen Reactions  . Diazepam     Other reaction(s): pruritis  . Diflucan [Fluconazole]   . Metronidazole     Other reaction(s): Angioedema  . Sulfa Antibiotics Hives  . Zyrtec  [Cetirizine]     Other reaction(s): Hives  . Latex Rash    Past Surgical History  Procedure Laterality Date  . Bunionectomy Bilateral 2013, 2014  . Colposcopy  2011    Social History  Substance Use Topics  . Smoking status: Never Smoker   . Smokeless tobacco: None  . Alcohol Use: 0.0 oz/week    0 Standard drinks or equivalent per week     Comment: social     Medication list has been reviewed and updated.   Physical Exam  Constitutional: She  is oriented to person, place, and time. She appears well-developed. No distress.  HENT:  Head: Normocephalic and atraumatic.  Cardiovascular: Normal rate, regular rhythm and normal heart sounds.   Pulmonary/Chest: Effort normal and breath sounds normal. No respiratory distress.  Musculoskeletal:       Left ankle: She exhibits swelling. She exhibits normal range of motion, no deformity and normal pulse. Tenderness.  Ganglion on mid left dorsal foot is smaller.  Tender but soft.    Neurological: She is alert and oriented to person, place, and time.  Skin:  Skin is warm and dry. No rash noted.  Psychiatric: She has a normal mood and affect.  Nursing note and vitals reviewed.   BP 116/78 mmHg  Pulse 82  Resp 16  Ht 5\' 7"  (1.702 m)  Wt 215 lb (97.523 kg)  BMI 33.67 kg/m2  SpO2 100%  Assessment and Plan: 1. Ganglion cyst of left foot Still symptomatic - Ambulatory referral to Podiatry  2. Depression, major, single episode, in partial remission (HCC) Will increase medication - DULoxetine (CYMBALTA) 60 MG capsule; Take 1 capsule (60 mg total) by mouth daily.  Dispense: 30 capsule; Refill: 5   Bari EdwardLaura Broden Holt, MD [facility]  06/01/2016

## 2016-06-29 ENCOUNTER — Encounter: Payer: Self-pay | Admitting: Internal Medicine

## 2016-06-29 ENCOUNTER — Ambulatory Visit (INDEPENDENT_AMBULATORY_CARE_PROVIDER_SITE_OTHER): Payer: Medicaid Other | Admitting: Internal Medicine

## 2016-06-29 VITALS — BP 106/78 | HR 89 | Resp 16 | Ht 67.0 in | Wt 220.0 lb

## 2016-06-29 DIAGNOSIS — N6089 Other benign mammary dysplasias of unspecified breast: Secondary | ICD-10-CM | POA: Insufficient documentation

## 2016-06-29 DIAGNOSIS — N6081 Other benign mammary dysplasias of right breast: Secondary | ICD-10-CM

## 2016-06-29 DIAGNOSIS — J4520 Mild intermittent asthma, uncomplicated: Secondary | ICD-10-CM | POA: Diagnosis not present

## 2016-06-29 DIAGNOSIS — Z0000 Encounter for general adult medical examination without abnormal findings: Secondary | ICD-10-CM

## 2016-06-29 DIAGNOSIS — F324 Major depressive disorder, single episode, in partial remission: Secondary | ICD-10-CM

## 2016-06-29 DIAGNOSIS — Z1239 Encounter for other screening for malignant neoplasm of breast: Secondary | ICD-10-CM

## 2016-06-29 DIAGNOSIS — Z8742 Personal history of other diseases of the female genital tract: Secondary | ICD-10-CM

## 2016-06-29 LAB — POCT URINALYSIS DIPSTICK
Bilirubin, UA: NEGATIVE
Blood, UA: NEGATIVE
GLUCOSE UA: NEGATIVE
KETONES UA: NEGATIVE
LEUKOCYTES UA: NEGATIVE
Nitrite, UA: NEGATIVE
PROTEIN UA: NEGATIVE
Spec Grav, UA: 1.01
UROBILINOGEN UA: 0.2
pH, UA: 7

## 2016-06-29 NOTE — Progress Notes (Signed)
Date:  06/29/2016   Name:  Beverly Thomas   DOB:  12/22/1980   MRN:  [id number]   Chief Complaint: Annual Exam Beverly Thomas is a 35 y.o. female who presents today for her Complete Annual Exam. She feels well. She reports exercising none. She reports she is sleeping well. She denies breast problems.  She has an IUD and has no menstrual bleeding. Her asthma is stable without recent symptoms.  Her mood and sleep are good. 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   Review of Systems  Constitutional: Negative for fever, chills and fatigue.  HENT: Negative for congestion, hearing loss, tinnitus, trouble swallowing and voice change.   Eyes: Negative for visual disturbance.  Respiratory: Negative for cough, chest tightness, shortness of breath and wheezing.   Cardiovascular: Negative for chest pain, palpitations and leg swelling.  Gastrointestinal: Negative for vomiting, abdominal pain, diarrhea and constipation.  Endocrine: Negative for polydipsia and polyuria.  Genitourinary: Negative for dysuria, frequency, vaginal bleeding, vaginal discharge and genital sores.  Musculoskeletal: Negative for joint swelling, arthralgias and gait problem.  Skin: Negative for color change and rash.  Neurological: Negative for dizziness, tremors, light-headedness and headaches.  Hematological: Negative for adenopathy. Does not bruise/bleed easily.  Psychiatric/Behavioral: Negative for sleep disturbance and dysphoric mood. The patient is not nervous/anxious.     Patient Active Problem List   Diagnosis Date Noted  . H/O abnormal cervical Papanicolaou smear 05/27/2015  . Angio-edema 05/27/2015  . Allergic state 05/27/2015  . 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 Asthma, mild intermittent 05/27/2015  . Back muscle spasm 05/27/2015  . Avitaminosis D 05/27/2015    Prior to Admission medications   Medication Sig  Start Date End Date Taking? Authorizing Provider  albuterol (PROVENTIL HFA;VENTOLIN HFA) 108 (90 Base) MCG/ACT inhaler Inhale 2 puffs into the lungs every 6 (six) hours as needed for wheezing or shortness of breath. 02/03/16  Yes Reubin MilanLaura H Jansel Vonstein, MD  b complex vitamins tablet Take 1 tablet by mouth daily.   Yes Historical Provider, MD  Calcium Carb-Cholecalciferol (CALCIUM 1000 + D PO) Take by mouth.   Yes Historical Provider, MD  Cholecalciferol (VITAMIN D3) 5000 units CAPS Take by mouth.   Yes Historical Provider, MD  cyclobenzaprine (FLEXERIL) 10 MG tablet TAKE 1/2 TO 1 TABLET TWICE A DAY 10/22/15  Yes Reubin MilanLaura H Vanita Cannell, MD  DULoxetine (CYMBALTA) 60 MG capsule Take 1 capsule (60 mg total) by mouth daily. 06/01/16  Yes Reubin MilanLaura H Yannick Steuber, MD  fluticasone (FLONASE) 50 MCG/ACT nasal spray Place 2 sprays into the nose daily. 04/13/14  Yes Historical Provider, MD  Fluticasone-Salmeterol (ADVAIR) 100-50 MCG/DOSE AEPB Inhale 1 puff into the lungs 2 (two) times daily. 02/03/16  Yes Reubin MilanLaura H Guled Gahan, MD  levonorgestrel (MIRENA) 20 MCG/24HR IUD by Intrauterine route.   Yes Historical Provider, MD  loratadine (CLARITIN) 10 MG tablet Take 1 tablet (10 mg total) by mouth daily. 02/03/16  Yes Reubin MilanLaura H Daenerys Buttram, MD  Probiotic Product (PROBIOTIC ADVANCED PO) Take by mouth.   Yes Historical Provider, MD  triamcinolone ointment (KENALOG) 0.5 % Apply 1 application topically 2 (two) times daily. 08/06/15  Yes Duanne Limerickeanna C Jones, MD    Allergies  Allergen Reactions  . Diazepam     Other reaction(s): pruritis  . Diflucan [Fluconazole]   . Metronidazole     Other reaction(s): Angioedema  . Sulfa Antibiotics Hives  . Zyrtec  [Cetirizine]  Other reaction(s): Hives  . Latex Rash    Past Surgical History  Procedure Laterality Date  . Bunionectomy Bilateral 2013, 2014  . Colposcopy  2011    Social History  Substance Use Topics  . Smoking status: Never Smoker   . Smokeless tobacco: None  . Alcohol Use: 0.0 oz/week      0 Standard drinks or equivalent per week     Comment: social     Medication list has been reviewed and updated.   Physical Exam  Constitutional: She is oriented to person, place, and time. She appears well-developed and well-nourished. No distress.  HENT:  Head: Normocephalic and atraumatic.  Right Ear: Tympanic membrane and ear canal normal.  Left Ear: Tympanic membrane and ear canal normal.  Nose: Right sinus exhibits no maxillary sinus tenderness. Left sinus exhibits no maxillary sinus tenderness.  Mouth/Throat: Uvula is midline and oropharynx is clear and moist.  Eyes: Conjunctivae and EOM are normal. Right eye exhibits no discharge. Left eye exhibits no discharge. No scleral icterus.  Neck: Normal range of motion. Carotid bruit is not present. No erythema present. No thyromegaly present.  Cardiovascular: Normal rate, regular rhythm, normal heart sounds and normal pulses.   Pulmonary/Chest: Effort normal. No respiratory distress. She has no wheezes. Right breast exhibits skin change (cyst 1 cm non tender at 10 oclock on nipple). Right breast exhibits no mass, no nipple discharge and no tenderness. Left breast exhibits no mass, no nipple discharge, no skin change and no tenderness.  Abdominal: Soft. Bowel sounds are normal. There is no hepatosplenomegaly. There is no tenderness. There is no CVA tenderness.  Genitourinary: Vagina normal and uterus normal. There is no tenderness, lesion or injury on the right labia. There is no tenderness, lesion or injury on the left labia. Cervix exhibits no motion tenderness, no discharge and no friability. Right adnexum displays no mass, no tenderness and no fullness. Left adnexum displays no mass, no tenderness and no fullness.  Musculoskeletal: Normal range of motion.  Lymphadenopathy:    She has no cervical adenopathy.    She has no axillary adenopathy.  Neurological: She is alert and oriented to person, place, and time. She has normal reflexes.  No cranial nerve deficit or sensory deficit.  Skin: Skin is warm, dry and intact. No rash noted.  Psychiatric: She has a normal mood and affect. Her speech is normal and behavior is normal. Thought content normal.  Nursing note and vitals reviewed.   BP 106/78 mmHg  Pulse 89  Resp 16  Ht  (1.702 m)  Wt 220 lb (99.791 kg)  BMI 34.45 kg/m2  SpO2 99%  Assessment and Plan: 1. Annual physical exam Recommend beginning regular exercise - CBC with Differential/Platelet - Lipid panel - POCT urinalysis dipstick  2. H/O abnormal cervical Papanicolaou smear - Pap IG and HPV (high risk) DNA detection  3. Asthma, mild intermittent, uncomplicated stable  4. Depression, major, single episode, in partial remission (HCC) controlled - Comprehensive metabolic panel - TSH  5. Breast cancer screening Baseline recommended - MM DIGITAL SCREENING BILATERAL; Future  6. Cyst, sebaceous, breast, right Patient will monitor for change in size, pain, drainage   Bari Edward, MD [facility]  06/29/2016

## 2016-06-29 NOTE — Patient Instructions (Signed)
Health Maintenance, Female Adopting a healthy lifestyle and getting preventive care can go a long way to promote health and wellness. Talk with your health care provider about what schedule of regular examinations is right for you. This is a good chance for you to check in with your provider about disease prevention and staying healthy. In between checkups, there are plenty of things you can do on your own. Experts have done a lot of research about which lifestyle changes and preventive measures are most likely to keep you healthy. Ask your health care provider for more information. WEIGHT AND DIET  Eat a healthy diet  Be sure to include plenty of vegetables, fruits, low-fat dairy products, and lean protein.  Do not eat a lot of foods high in solid fats, added sugars, or salt.  Get regular exercise. This is one of the most important things you can do for your health.  Most adults should exercise for at least 150 minutes each week. The exercise should increase your heart rate and make you sweat (moderate-intensity exercise).  Most adults should also do strengthening exercises at least twice a week. This is in addition to the moderate-intensity exercise.  Maintain a healthy weight  Body mass index (BMI) is a measurement that can be used to identify possible weight problems. It estimates body fat based on height and weight. Your health care provider can help determine your BMI and help you achieve or maintain a healthy weight.  For females 20 years of age and older:   A BMI below 18.5 is considered underweight.  A BMI of 18.5 to 24.9 is normal.  A BMI of 25 to 29.9 is considered overweight.  A BMI of 30 and above is considered obese.  Watch levels of cholesterol and blood lipids  You should start having your blood tested for lipids and cholesterol at 35 years of age, then have this test every 5 years.  You may need to have your cholesterol levels checked more often if:  Your lipid  or cholesterol levels are high.  You are older than 35 years of age.  You are at high risk for heart disease.  CANCER SCREENING   Lung Cancer  Lung cancer screening is recommended for adults 55-80 years old who are at high risk for lung cancer because of a history of smoking.  A yearly low-dose CT scan of the lungs is recommended for people who:  Currently smoke.  Have quit within the past 15 years.  Have at least a 30-pack-year history of smoking. A pack year is smoking an average of one pack of cigarettes a day for 1 year.  Yearly screening should continue until it has been 15 years since you quit.  Yearly screening should stop if you develop a health problem that would prevent you from having lung cancer treatment.  Breast Cancer  Practice breast self-awareness. This means understanding how your breasts normally appear and feel.  It also means doing regular breast self-exams. Let your health care provider know about any changes, no matter how small.  If you are in your 20s or 30s, you should have a clinical breast exam (CBE) by a health care provider every 1-3 years as part of a regular health exam.  If you are 40 or older, have a CBE every year. Also consider having a breast X-ray (mammogram) every year.  If you have a family history of breast cancer, talk to your health care provider about genetic screening.  If you   are at high risk for breast cancer, talk to your health care provider about having an MRI and a mammogram every year.  Breast cancer gene (BRCA) assessment is recommended for women who have family members with BRCA-related cancers. BRCA-related cancers include:  Breast.  Ovarian.  Tubal.  Peritoneal cancers.  Results of the assessment will determine the need for genetic counseling and BRCA1 and BRCA2 testing. Cervical Cancer Your health care provider may recommend that you be screened regularly for cancer of the pelvic organs (ovaries, uterus, and  vagina). This screening involves a pelvic examination, including checking for microscopic changes to the surface of your cervix (Pap test). You may be encouraged to have this screening done every 3 years, beginning at age 21.  For women ages 30-65, health care providers may recommend pelvic exams and Pap testing every 3 years, or they may recommend the Pap and pelvic exam, combined with testing for human papilloma virus (HPV), every 5 years. Some types of HPV increase your risk of cervical cancer. Testing for HPV may also be done on women of any age with unclear Pap test results.  Other health care providers may not recommend any screening for nonpregnant women who are considered low risk for pelvic cancer and who do not have symptoms. Ask your health care provider if a screening pelvic exam is right for you.  If you have had past treatment for cervical cancer or a condition that could lead to cancer, you need Pap tests and screening for cancer for at least 20 years after your treatment. If Pap tests have been discontinued, your risk factors (such as having a new sexual partner) need to be reassessed to determine if screening should resume. Some women have medical problems that increase the chance of getting cervical cancer. In these cases, your health care provider may recommend more frequent screening and Pap tests. Colorectal Cancer  This type of cancer can be detected and often prevented.  Routine colorectal cancer screening usually begins at 35 years of age and continues through 35 years of age.  Your health care provider may recommend screening at an earlier age if you have risk factors for colon cancer.  Your health care provider may also recommend using home test kits to check for hidden blood in the stool.  A small camera at the end of a tube can be used to examine your colon directly (sigmoidoscopy or colonoscopy). This is done to check for the earliest forms of colorectal  cancer.  Routine screening usually begins at age 50.  Direct examination of the colon should be repeated every 5-10 years through 35 years of age. However, you may need to be screened more often if early forms of precancerous polyps or small growths are found. Skin Cancer  Check your skin from head to toe regularly.  Tell your health care provider about any new moles or changes in moles, especially if there is a change in a mole's shape or color.  Also tell your health care provider if you have a mole that is larger than the size of a pencil eraser.  Always use sunscreen. Apply sunscreen liberally and repeatedly throughout the day.  Protect yourself by wearing long sleeves, pants, a wide-brimmed hat, and sunglasses whenever you are outside. HEART DISEASE, DIABETES, AND HIGH BLOOD PRESSURE   High blood pressure causes heart disease and increases the risk of stroke. High blood pressure is more likely to develop in:  People who have blood pressure in the high end   of the normal range (130-139/85-89 mm Hg).  People who are overweight or obese.  People who are African American.  If you are 38-23 years of age, have your blood pressure checked every 3-5 years. If you are 61 years of age or older, have your blood pressure checked every year. You should have your blood pressure measured twice--once when you are at a hospital or clinic, and once when you are not at a hospital or clinic. Record the average of the two measurements. To check your blood pressure when you are not at a hospital or clinic, you can use:  An automated blood pressure machine at a pharmacy.  A home blood pressure monitor.  If you are between 45 years and 39 years old, ask your health care provider if you should take aspirin to prevent strokes.  Have regular diabetes screenings. This involves taking a blood sample to check your fasting blood sugar level.  If you are at a normal weight and have a low risk for diabetes,  have this test once every three years after 35 years of age.  If you are overweight and have a high risk for diabetes, consider being tested at a younger age or more often. PREVENTING INFECTION  Hepatitis B  If you have a higher risk for hepatitis B, you should be screened for this virus. You are considered at high risk for hepatitis B if:  You were born in a country where hepatitis B is common. Ask your health care provider which countries are considered high risk.  Your parents were born in a high-risk country, and you have not been immunized against hepatitis B (hepatitis B vaccine).  You have HIV or AIDS.  You use needles to inject street drugs.  You live with someone who has hepatitis B.  You have had sex with someone who has hepatitis B.  You get hemodialysis treatment.  You take certain medicines for conditions, including cancer, organ transplantation, and autoimmune conditions. Hepatitis C  Blood testing is recommended for:  Everyone born from [birth year] through 1965.  Anyone with known risk factors for hepatitis C. Sexually transmitted infections (STIs)  You should be screened for sexually transmitted infections (STIs) including gonorrhea and chlamydia if:  You are sexually active and are younger than 35 years of age.  You are older than 35 years of age and your health care provider tells you that you are at risk for this type of infection.  Your sexual activity has changed since you were last screened and you are at an increased risk for chlamydia or gonorrhea. Ask your health care provider if you are at risk.  If you do not have HIV, but are at risk, it may be recommended that you take a prescription medicine daily to prevent HIV infection. This is called pre-exposure prophylaxis (PrEP). You are considered at risk if:  You are sexually active and do not regularly use condoms or know the HIV status of your partner(s).  You take drugs by injection.  You are sexually  active with a partner who has HIV. Talk with your health care provider about whether you are at high risk of being infected with HIV. If you choose to begin PrEP, you should first be tested for HIV. You should then be tested every 3 months for as long as you are taking PrEP.  PREGNANCY   If you are premenopausal and you may become pregnant, ask your health care provider about preconception counseling.  If you may  become pregnant, take 400 to 800 micrograms (mcg) of folic acid every day.  If you want to prevent pregnancy, talk to your health care provider about birth control (contraception). OSTEOPOROSIS AND MENOPAUSE   Osteoporosis is a disease in which the bones lose minerals and strength with aging. This can result in serious bone fractures. Your risk for osteoporosis can be identified using a bone density scan.  If you are 61 years of age or older, or if you are at risk for osteoporosis and fractures, ask your health care provider if you should be screened.  Ask your health care provider whether you should take a calcium or vitamin D supplement to lower your risk for osteoporosis.  Menopause may have certain physical symptoms and risks.  Hormone replacement therapy may reduce some of these symptoms and risks. Talk to your health care provider about whether hormone replacement therapy is right for you.  HOME CARE INSTRUCTIONS   Schedule regular health, dental, and eye exams.  Stay current with your immunizations.   Do not use any tobacco products including cigarettes, chewing tobacco, or electronic cigarettes.  If you are pregnant, do not drink alcohol.  If you are breastfeeding, limit how much and how often you drink alcohol.  Limit alcohol intake to no more than 1 drink per day for nonpregnant women. One drink equals 12 ounces of beer, 5 ounces of wine, or 1 ounces of hard liquor.  Do not use street drugs.  Do not share needles.  Ask your health care provider for help if  you need support or information about quitting drugs.  Tell your health care provider if you often feel depressed.  Tell your health care provider if you have ever been abused or do not feel safe at home.   This information is not intended to replace advice given to you by your health care provider. Make sure you discuss any questions you have with your health care provider.   Document Released: 06/22/2011 Document Revised: 12/28/2014 Document Reviewed: 11/08/2013 Elsevier Interactive Patient Education [employer].

## 2016-06-30 LAB — TSH: TSH: 3.55 u[IU]/mL (ref 0.450–4.500)

## 2016-06-30 LAB — CBC WITH DIFFERENTIAL/PLATELET
BASOS: 0 %
Basophils Absolute: 0 10*3/uL (ref 0.0–0.2)
EOS (ABSOLUTE): 0.2 10*3/uL (ref 0.0–0.4)
EOS: 2 %
HEMATOCRIT: 38.8 % (ref 34.0–46.6)
Hemoglobin: 12.4 g/dL (ref 11.1–15.9)
IMMATURE GRANULOCYTES: 0 %
Immature Grans (Abs): 0 10*3/uL (ref 0.0–0.1)
LYMPHS ABS: 2.9 10*3/uL (ref 0.7–3.1)
Lymphs: 36 %
MCH: 27.4 pg (ref 26.6–33.0)
MCHC: 32 g/dL (ref 31.5–35.7)
MCV: 86 fL (ref 79–97)
MONOS ABS: 0.8 10*3/uL (ref 0.1–0.9)
Monocytes: 10 %
NEUTROS PCT: 52 %
Neutrophils Absolute: 4.2 10*3/uL (ref 1.4–7.0)
PLATELETS: 323 10*3/uL (ref 150–379)
RBC: 4.53 x10E6/uL (ref 3.77–5.28)
RDW: 14.6 % (ref 12.3–15.4)
WBC: 8 10*3/uL (ref 3.4–10.8)

## 2016-06-30 LAB — COMPREHENSIVE METABOLIC PANEL
A/G RATIO: 1.6 (ref 1.2–2.2)
ALK PHOS: 62 IU/L (ref 39–117)
ALT: 16 IU/L (ref 0–32)
AST: 16 IU/L (ref 0–40)
Albumin: 4.1 g/dL (ref 3.5–5.5)
BILIRUBIN TOTAL: 0.4 mg/dL (ref 0.0–1.2)
BUN/Creatinine Ratio: 13 (ref 9–23)
BUN: 9 mg/dL (ref 6–20)
CHLORIDE: 98 mmol/L (ref 96–106)
CO2: 24 mmol/L (ref 18–29)
Calcium: 8.7 mg/dL (ref 8.7–10.2)
Creatinine, Ser: 0.72 mg/dL (ref 0.57–1.00)
GFR calc Af Amer: 125 mL/min/{1.73_m2} (ref >59)
GFR calc non Af Amer: 109 mL/min/{1.73_m2} (ref >59)
GLOBULIN, TOTAL: 2.5 g/dL (ref 1.5–4.5)
Glucose: 73 mg/dL (ref 65–99)
POTASSIUM: 3.9 mmol/L (ref 3.5–5.2)
SODIUM: 141 mmol/L (ref 134–144)
Total Protein: 6.6 g/dL (ref 6.0–8.5)

## 2016-06-30 LAB — LIPID PANEL
CHOL/HDL RATIO: 2.8 ratio (ref 0.0–4.4)
CHOLESTEROL TOTAL: 187 mg/dL (ref 100–199)
HDL: 66 mg/dL (ref >39)
LDL Calculated: 104 mg/dL — ABNORMAL HIGH (ref 0–99)
TRIGLYCERIDES: 84 mg/dL (ref 0–149)
VLDL Cholesterol Cal: 17 mg/dL (ref 5–40)

## 2016-07-01 LAB — PAP IG AND HPV HIGH-RISK
HPV, HIGH-RISK: NEGATIVE
PAP Smear Comment: 0

## 2016-07-08 ENCOUNTER — Ambulatory Visit: Payer: Self-pay

## 2016-07-13 ENCOUNTER — Ambulatory Visit
Admission: RE | Admit: 2016-07-13 | Discharge: 2016-07-13 | Disposition: A | Discharge status: Home / Self Care | Payer: Medicaid Other | Source: Ambulatory Visit | Attending: Internal Medicine | Admitting: Internal Medicine

## 2016-07-13 DIAGNOSIS — Z1239 Encounter for other screening for malignant neoplasm of breast: Secondary | ICD-10-CM

## 2016-07-13 DIAGNOSIS — Z1231 Encounter for screening mammogram for malignant neoplasm of breast: Secondary | ICD-10-CM | POA: Diagnosis present

## 2017-05-13 ENCOUNTER — Ambulatory Visit (INDEPENDENT_AMBULATORY_CARE_PROVIDER_SITE_OTHER): Payer: Medicaid Other | Admitting: Family Medicine

## 2017-05-13 VITALS — BP 120/70 | HR 84 | Ht 67.0 in | Wt 216.0 lb

## 2017-05-13 DIAGNOSIS — J4520 Mild intermittent asthma, uncomplicated: Secondary | ICD-10-CM | POA: Diagnosis not present

## 2017-05-13 DIAGNOSIS — J40 Bronchitis, not specified as acute or chronic: Secondary | ICD-10-CM | POA: Diagnosis not present

## 2017-05-13 MED ORDER — GUAIFENESIN-CODEINE 100-10 MG/5ML PO SYRP: 5.0000 mL | ORAL_SOLUTION | Freq: Three times a day (TID) | ORAL | 0 refills | Status: DC | PRN

## 2017-05-13 MED ORDER — ALBUTEROL SULFATE HFA 108 (90 BASE) MCG/ACT IN AERS: 2.0000 | INHALATION_SPRAY | Freq: Four times a day (QID) | RESPIRATORY_TRACT | 5 refills | Status: DC | PRN

## 2017-05-13 MED ORDER — IPRATROPIUM-ALBUTEROL 0.5-2.5 (3) MG/3ML IN SOLN: 3.0000 mL | Freq: Once | RESPIRATORY_TRACT | Status: DC

## 2017-05-13 MED ORDER — PREDNISONE 10 MG PO TABS: 10.0000 mg | ORAL_TABLET | Freq: Every day | ORAL | 0 refills | Status: DC

## 2017-05-13 MED ORDER — AZITHROMYCIN 250 MG PO TABS: ORAL_TABLET | ORAL | 0 refills | Status: DC

## 2017-05-13 NOTE — Progress Notes (Signed)
Name: Beverly Thomas   MRN: [id number]    DOB: [birth year]   Date:05/13/2017       Progress Note  Subjective  Chief Complaint  Chief Complaint  Patient presents with  . Follow-up    asthma follow up from urgent care- started her on singulair and allegra, increased inhaler dosing, gave a breathing Tx while there    Cough  This is a new problem. The current episode started in the past 7 days. The problem has been unchanged. The cough is non-productive. Associated symptoms include headaches, nasal congestion, postnasal drip, rhinorrhea, shortness of breath and wheezing. Pertinent negatives include no chest pain, chills, ear congestion, ear pain, fever, heartburn, hemoptysis, myalgias, rash, sore throat or weight loss. The symptoms are aggravated by pollens (change in weather). She has tried a beta-agonist inhaler and leukotriene antagonists for the symptoms. The treatment provided mild relief. Her past medical history is significant for asthma. There is no history of environmental allergies.    No problem-specific Assessment & Plan notes found for this encounter.   Past Medical History:  Diagnosis Date  . Allergy     Past Surgical History:  Procedure Laterality Date  . BUNIONECTOMY Bilateral 2013, 2014  . COLPOSCOPY  2011    Family History  Problem Relation Age of Onset  . Diabetes Mother   . Hypertension Mother   . Breast cancer Maternal Aunt        great aunt    Social History   Social History  . Marital status: Single    Spouse name: N/A  . Number of children: N/A  . Years of education: N/A   Occupational History  . Not on file.   Social History Main Topics  . Smoking status: Never Smoker  . Smokeless tobacco: Not on file  . Alcohol use 0.0 oz/week     Comment: social  . Drug use: No  . Sexual activity: Not Currently   Other Topics Concern  . Not on file   Social History Narrative  . No narrative on file    Allergies  Allergen Reactions  . Diazepam    Other reaction(s): pruritis  . Diflucan [Fluconazole]   . Metronidazole     Other reaction(s): Angioedema  . Sulfa Antibiotics Hives  . Zyrtec  [Cetirizine]     Other reaction(s): Hives  . Latex Rash    Outpatient Medications Prior to Visit  Medication Sig Dispense Refill  . b complex vitamins tablet Take 1 tablet by mouth daily.    . Calcium Carb-Cholecalciferol (CALCIUM 1000 + D PO) Take by mouth.    . Cholecalciferol (VITAMIN D3) 5000 units CAPS Take by mouth.    . cyclobenzaprine (FLEXERIL) 10 MG tablet TAKE 1/2 TO 1 TABLET TWICE A DAY 30 tablet 5  . DULoxetine (CYMBALTA) 60 MG capsule Take 1 capsule (60 mg total) by mouth daily. 30 capsule 5  . fluticasone (FLONASE) 50 MCG/ACT nasal spray Place 2 sprays into the nose daily.    . Fluticasone-Salmeterol (ADVAIR) 100-50 MCG/DOSE AEPB Inhale 1 puff into the lungs 2 (two) times daily. 1 each 5  . levonorgestrel (MIRENA) 20 MCG/24HR IUD by Intrauterine route.    . loratadine (CLARITIN) 10 MG tablet Take 1 tablet (10 mg total) by mouth daily. 30 tablet 12  . Probiotic Product (PROBIOTIC ADVANCED PO) Take by mouth.    Marland Kitchen albuterol (PROVENTIL HFA;VENTOLIN HFA) 108 (90 Base) MCG/ACT inhaler Inhale 2 puffs into the lungs every 6 (six) hours as needed for  wheezing or shortness of breath. 1 Inhaler 5  . triamcinolone ointment (KENALOG) 0.5 % Apply 1 application topically 2 (two) times daily. 30 g 0   No facility-administered medications prior to visit.     Review of Systems  Constitutional: Negative for chills, fever, malaise/fatigue and weight loss.  HENT: Positive for postnasal drip and rhinorrhea. Negative for ear discharge, ear pain and sore throat.   Eyes: Negative for blurred vision.  Respiratory: Positive for cough, shortness of breath and wheezing. Negative for hemoptysis and sputum production.   Cardiovascular: Negative for chest pain, palpitations and leg swelling.  Gastrointestinal: Negative for abdominal pain, blood in stool,  constipation, diarrhea, heartburn, melena and nausea.  Genitourinary: Negative for dysuria, frequency, hematuria and urgency.  Musculoskeletal: Negative for back pain, joint pain, myalgias and neck pain.  Skin: Negative for rash.  Neurological: Positive for headaches. Negative for dizziness, tingling, sensory change and focal weakness.  Endo/Heme/Allergies: Negative for environmental allergies and polydipsia. Does not bruise/bleed easily.  Psychiatric/Behavioral: Negative for depression and suicidal ideas. The patient is not nervous/anxious and does not have insomnia.      Objective  Vitals:   05/13/17 0823  BP: 120/70  Pulse: 84  SpO2: 99%  Weight: 216 lb (98 kg)  Height: 5\' 7"  (1.702 m)    Physical Exam  Constitutional: She is well-developed, well-nourished, and in no distress. No distress.  HENT:  Head: Normocephalic and atraumatic.  Right Ear: External ear normal.  Left Ear: External ear normal.  Nose: Nose normal.  Mouth/Throat: Oropharynx is clear and moist.  Eyes: Conjunctivae and EOM are normal. Pupils are equal, round, and reactive to light. Right eye exhibits no discharge. Left eye exhibits no discharge.  Neck: Normal range of motion. Neck supple. No JVD present. No thyromegaly present.  Cardiovascular: Normal rate, regular rhythm, normal heart sounds and intact distal pulses.  Exam reveals no gallop and no friction rub.   No murmur heard. Pulmonary/Chest: Effort normal. She has wheezes. She has no rales.  Abdominal: Soft. Bowel sounds are normal. She exhibits no mass. There is no tenderness. There is no guarding.  Musculoskeletal: Normal range of motion. She exhibits no edema.  Lymphadenopathy:    She has no cervical adenopathy.  Neurological: She is alert. She has normal reflexes.  Skin: Skin is warm and dry. She is not diaphoretic.  Psychiatric: Mood and affect normal.  Nursing note and vitals reviewed.     Assessment & Plan  Problem List Items Addressed  This Visit      Respiratory   Asthma, mild intermittent - Primary   Relevant Medications   montelukast (SINGULAIR) 10 MG tablet   predniSONE (DELTASONE) 10 MG tablet   ipratropium-albuterol (DUONEB) 0.5-2.5 (3) MG/3ML nebulizer solution 3 mL   albuterol (PROVENTIL HFA;VENTOLIN HFA) 108 (90 Base) MCG/ACT inhaler   RESOLVED: Bronchitis   Relevant Medications   azithromycin (ZITHROMAX) 250 MG tablet   guaiFENesin-codeine (ROBITUSSIN AC) 100-10 MG/5ML syrup      Meds ordered this encounter  Medications  . azithromycin (ZITHROMAX) 250 MG tablet    Sig: 2 today then 1 a day for 4 days    Dispense:  6 tablet    Refill:  0  . predniSONE (DELTASONE) 10 MG tablet    Sig: Take 1 tablet (10 mg total) by mouth daily with breakfast.    Dispense:  30 tablet    Refill:  0    Taper4,4,4,3,3,3,2,2,2,1,1,1  . ipratropium-albuterol (DUONEB) 0.5-2.5 (3) MG/3ML nebulizer solution 3 mL  .  albuterol (PROVENTIL HFA;VENTOLIN HFA) 108 (90 Base) MCG/ACT inhaler    Sig: Inhale 2 puffs into the lungs every 6 (six) hours as needed for wheezing or shortness of breath.    Dispense:  1 Inhaler    Refill:  5  . guaiFENesin-codeine (ROBITUSSIN AC) 100-10 MG/5ML syrup    Sig: Take 5 mLs by mouth 3 (three) times daily as needed for cough.    Dispense:  150 mL    Refill:  0      Dr. Elizabeth Sauereanna Ashritha [place] [facility]  05/13/17

## 2017-06-11 ENCOUNTER — Ambulatory Visit: Payer: Medicaid Other | Admitting: Family Medicine

## 2017-06-15 ENCOUNTER — Ambulatory Visit (INDEPENDENT_AMBULATORY_CARE_PROVIDER_SITE_OTHER): Payer: Medicaid Other | Admitting: Internal Medicine

## 2017-06-15 ENCOUNTER — Other Ambulatory Visit: Payer: Self-pay | Admitting: Internal Medicine

## 2017-06-15 VITALS — BP 124/62 | HR 104 | Temp 98.5°F | Ht 67.0 in | Wt 216.8 lb

## 2017-06-15 DIAGNOSIS — J4520 Mild intermittent asthma, uncomplicated: Secondary | ICD-10-CM | POA: Diagnosis not present

## 2017-06-15 DIAGNOSIS — T7840XD Allergy, unspecified, subsequent encounter: Secondary | ICD-10-CM | POA: Diagnosis not present

## 2017-06-15 DIAGNOSIS — B354 Tinea corporis: Secondary | ICD-10-CM

## 2017-06-15 MED ORDER — FLUTICASONE-SALMETEROL 250-50 MCG/DOSE IN AEPB: 1.0000 | INHALATION_SPRAY | Freq: Two times a day (BID) | RESPIRATORY_TRACT | 12 refills | Status: DC

## 2017-06-15 MED ORDER — FLUTICASONE-SALMETEROL 230-21 MCG/ACT IN AERO: 2.0000 | INHALATION_SPRAY | Freq: Two times a day (BID) | RESPIRATORY_TRACT | 12 refills | Status: DC

## 2017-06-15 MED ORDER — NYSTATIN 100000 UNIT/GM EX CREA: 1.0000 "application " | TOPICAL_CREAM | Freq: Two times a day (BID) | CUTANEOUS | 0 refills | Status: DC

## 2017-06-15 MED ORDER — MONTELUKAST SODIUM 10 MG PO TABS: 10.0000 mg | ORAL_TABLET | Freq: Every day | ORAL | 11 refills | Status: DC

## 2017-06-15 MED ORDER — FEXOFENADINE HCL 60 MG PO TABS: 60.0000 mg | ORAL_TABLET | Freq: Two times a day (BID) | ORAL | 5 refills | Status: DC

## 2017-06-15 NOTE — Progress Notes (Signed)
Date:  06/15/2017   Name:  Beverly Thomas   DOB:  [birth year]   MRN:  [id number]   Chief Complaint: Asthma (Follow up on asthma. Still feeling SOB. Got a lot better after finishing prednisone. Chest does not hurt when coughing. Abdominal muscles are sore from coughing so much. Cough is still present, no wheezing, no production. Would like refill on singulair and allegra. ) and Eczema (Panty line rubbing together causing friction and rash in between area. Tried over the counter cream and still no relief would like Rx to help. Burning and itching. ) Asthma  She complains of cough. There is no shortness of breath, sputum production or wheezing. This is a chronic problem. The problem occurs intermittently. Pertinent negatives include no chest pain, fever or sore throat. Her past medical history is significant for asthma.  Rash  Associated symptoms include coughing. Pertinent negatives include no congestion, fatigue, fever, shortness of breath or sore throat. Her past medical history is significant for asthma.  Allergies - still on Flonase as needed.  Using allegra daily and doing better.  Still has some cough but non productive.    Review of Systems  Constitutional: Negative for chills, fatigue and fever.  HENT: Negative for congestion and sore throat.   Respiratory: Positive for cough. Negative for sputum production, chest tightness, shortness of breath and wheezing.   Cardiovascular: Negative for chest pain and palpitations.  Genitourinary: Negative for genital sores and vaginal discharge.  Musculoskeletal: Negative for arthralgias.  Skin: Positive for rash.  Psychiatric/Behavioral: Negative for sleep disturbance.    Patient Active Problem List   Diagnosis Date Noted  . Cyst, sebaceous, breast 06/29/2016  . H/O abnormal cervical Papanicolaou smear 05/27/2015  . Angio-edema 05/27/2015  . Allergic state 05/27/2015  . Depression, major, single episode, in partial remission (HCC) 05/27/2015   . Asthma, mild intermittent 05/27/2015  . Back muscle spasm 05/27/2015  . Avitaminosis D 05/27/2015    Prior to Admission medications   Medication Sig Start Date End Date Taking? Authorizing Provider  albuterol (PROVENTIL HFA;VENTOLIN HFA) 108 (90 Base) MCG/ACT inhaler Inhale 2 puffs into the lungs every 6 (six) hours as needed for wheezing or shortness of breath. 05/13/17   Duanne Limerick, MD  azithromycin (ZITHROMAX) 250 MG tablet 2 today then 1 a day for 4 days 05/13/17   Duanne Limerick, MD  b complex vitamins tablet Take 1 tablet by mouth daily.    [provider]  Calcium Carb-Cholecalciferol (CALCIUM 1000 + D PO) Take by mouth.    [provider]  Cholecalciferol (VITAMIN D3) 5000 units CAPS Take by mouth.    [provider]  cyclobenzaprine (FLEXERIL) 10 MG tablet TAKE 1/2 TO 1 TABLET TWICE A DAY 10/22/15   Reubin Milan, MD  DULoxetine (CYMBALTA) 60 MG capsule Take 1 capsule (60 mg total) by mouth daily. 06/01/16   Reubin Milan, MD  fexofenadine (ALLEGRA) 60 MG tablet Take 1 tablet by mouth daily. 05/11/17 06/10/17  [provider]  fluticasone (FLONASE) 50 MCG/ACT nasal spray Place 2 sprays into the nose daily. 04/13/14   [provider]  Fluticasone-Salmeterol (ADVAIR) 100-50 MCG/DOSE AEPB Inhale 1 puff into the lungs 2 (two) times daily. 02/03/16   Reubin Milan, MD  guaiFENesin-codeine [facility]) 100-10 MG/5ML syrup Take 5 mLs by mouth 3 (three) times daily as needed for cough. 05/13/17   Duanne Limerick, MD  levonorgestrel (MIRENA) 20 MCG/24HR IUD by Intrauterine route.  [provider]  loratadine (CLARITIN) 10 MG tablet Take 1 tablet (10 mg total) by mouth daily. 02/03/16   Reubin MilanBerglund, Faythe Heitzenrater H, MD  montelukast (SINGULAIR) 10 MG tablet Take 1 tablet by mouth daily. 05/11/17 05/11/18  [provider]  predniSONE (DELTASONE) 10 MG tablet Take 1 tablet (10 mg total) by mouth daily with breakfast. 05/13/17   Duanne LimerickJones,  Deanna C, MD  Probiotic Product (PROBIOTIC ADVANCED PO) Take by mouth.    [provider]    Allergies  Allergen Reactions  . Diazepam     Other reaction(s): pruritis  . Diflucan [Fluconazole]   . Metronidazole     Other reaction(s): Angioedema  . Sulfa Antibiotics Hives  . Zyrtec  [Cetirizine]     Other reaction(s): Hives  . Latex Rash    Past Surgical History:  Procedure Laterality Date  . BUNIONECTOMY Bilateral 2013, 2014  . COLPOSCOPY  2011    Social History  Substance Use Topics  . Smoking status: Never Smoker  . Smokeless tobacco: Not on file  . Alcohol use 0.0 oz/week     Comment: social     Medication list has been reviewed and updated.   Physical Exam  Constitutional: She is oriented to person, place, and time. She appears well-developed. No distress.  HENT:  Head: Normocephalic and atraumatic.  Neck: Normal range of motion. Neck supple.  Cardiovascular: Normal rate, regular rhythm and normal heart sounds.   Pulmonary/Chest: Effort normal and breath sounds normal. No respiratory distress. She has no wheezes.  Musculoskeletal: Normal range of motion.  Neurological: She is alert and oriented to person, place, and time.  Skin: Skin is warm and dry. No rash noted.     Mild erythema in groin creases  Psychiatric: She has a normal mood and affect. Her speech is normal and behavior is normal. Thought content normal.  Nursing note and vitals reviewed.   BP 124/62   Pulse (!) 104   Temp 98.5 F (36.9 C)   Ht 5\' 7"  (1.702 m)   Wt 216 lb 12.8 oz (98.3 kg)   SpO2 97%   BMI 33.96 kg/m   Assessment and Plan: 1. Mild intermittent asthma without complication Increase dose of Advair Continue singulair and Allegra Use proair as needed for cough  2. Allergic state, subsequent encounter Continue medications  3. Tinea corporis Nystatin cream   Meds ordered this encounter  Medications  . montelukast (SINGULAIR) 10 MG tablet    Sig: Take 1  tablet (10 mg total) by mouth daily.    Dispense:  30 tablet    Refill:  11  . fexofenadine (ALLEGRA) 60 MG tablet    Sig: Take 1 tablet (60 mg total) by mouth 2 (two) times daily.    Dispense:  60 tablet    Refill:  5  . fluticasone-salmeterol (ADVAIR HFA) 230-21 MCG/ACT inhaler    Sig: Inhale 2 puffs into the lungs 2 (two) times daily.    Dispense:  1 Inhaler    Refill:  12  . nystatin cream (MYCOSTATIN)    Sig: Apply 1 application topically 2 (two) times daily.    Dispense:  30 g    Refill:  0    Bari EdwardLaura Asir Bingley, MD [facility]  06/15/2017

## 2017-06-30 ENCOUNTER — Encounter: Payer: Medicaid Other | Admitting: Internal Medicine

## 2017-10-21 DIAGNOSIS — Z3169 Encounter for other general counseling and advice on procreation: Secondary | ICD-10-CM | POA: Diagnosis not present

## 2017-10-21 DIAGNOSIS — Z30432 Encounter for removal of intrauterine contraceptive device: Secondary | ICD-10-CM | POA: Diagnosis not present

## 2017-12-15 ENCOUNTER — Encounter: Payer: Self-pay | Admitting: Internal Medicine

## 2017-12-15 ENCOUNTER — Other Ambulatory Visit: Payer: Self-pay | Admitting: Internal Medicine

## 2017-12-15 ENCOUNTER — Ambulatory Visit: Payer: Medicaid Other | Admitting: Internal Medicine

## 2017-12-15 VITALS — BP 122/78 | HR 82 | Ht 67.0 in | Wt 209.0 lb

## 2017-12-15 DIAGNOSIS — Z0289 Encounter for other administrative examinations: Secondary | ICD-10-CM | POA: Diagnosis not present

## 2017-12-15 DIAGNOSIS — J4520 Mild intermittent asthma, uncomplicated: Secondary | ICD-10-CM | POA: Diagnosis not present

## 2017-12-15 DIAGNOSIS — F324 Major depressive disorder, single episode, in partial remission: Secondary | ICD-10-CM

## 2017-12-15 MED ORDER — FLUTICASONE-SALMETEROL 500-50 MCG/DOSE IN AEPB: 1.0000 | INHALATION_SPRAY | Freq: Two times a day (BID) | RESPIRATORY_TRACT | 12 refills | Status: DC

## 2017-12-15 MED ORDER — ALBUTEROL SULFATE 1.25 MG/3ML IN NEBU: 1.0000 | INHALATION_SOLUTION | Freq: Four times a day (QID) | RESPIRATORY_TRACT | 3 refills | Status: DC | PRN

## 2017-12-15 MED ORDER — NEBULIZER/TUBING/MOUTHPIECE KIT: 1.0000 | PACK | Freq: Every day | 0 refills | Status: DC

## 2017-12-15 NOTE — Progress Notes (Signed)
Date:  12/15/2017   Name:  Beverly Thomas   DOB:  03/12/1981   MRN:  [id number]   Chief Complaint: Asthma (Wants to get medication for nebulizer and tubing. States her mother has the machine and she can use her machine. ) and Paperwork (Work form.) Asthma  She complains of chest tightness, cough and wheezing. There is no shortness of breath. This is a chronic problem. The current episode started more than 1 year ago. The problem occurs daily. The problem has been unchanged. The cough is non-productive. Pertinent negatives include no chest pain, fever, sore throat or trouble swallowing. Her symptoms are aggravated by change in weather. Her symptoms are alleviated by steroid inhaler, beta-agonist and leukotriene antagonist. She reports moderate improvement on treatment. Her past medical history is significant for asthma.   Work form - has form to confirm that she is okay to work.  Now at [employer].  She declines flu vaccine.  Depression - now off cymbalta and doing well.  Still seeing therapist regularly.  Review of Systems  Constitutional: Negative for chills, fatigue and fever.  HENT: Negative for congestion, sore throat and trouble swallowing.   Eyes: Negative for visual disturbance.  Respiratory: Positive for cough, chest tightness and wheezing. Negative for shortness of breath and stridor.   Cardiovascular: Negative for chest pain, palpitations and leg swelling.  Gastrointestinal: Negative for abdominal pain.  Skin: Negative for rash.  Allergic/Immunologic: Negative for environmental allergies and food allergies.  Neurological: Negative for dizziness and light-headedness.  Psychiatric/Behavioral: Negative for dysphoric mood and sleep disturbance.    Patient Active Problem List   Diagnosis Date Noted  . Cyst, sebaceous, breast 06/29/2016  . H/O abnormal cervical Papanicolaou smear 05/27/2015  . Angio-edema 05/27/2015  . Allergic state 05/27/2015  . Depression,  major, single episode, in partial remission ([place]) 05/27/2015  . Asthma, mild intermittent 05/27/2015  . Back muscle spasm 05/27/2015  . Avitaminosis D 05/27/2015    Prior to Admission medications   Medication Sig Start Date End Date Taking? Authorizing Provider  albuterol (PROVENTIL HFA;VENTOLIN HFA) 108 (90 Base) MCG/ACT inhaler Inhale 2 puffs into the lungs every 6 (six) hours as needed for wheezing or shortness of breath. 05/13/17  Yes Juline Patch, MD  b complex vitamins tablet Take 1 tablet by mouth daily.   Yes [provider]  Calcium Carb-Cholecalciferol (CALCIUM 1000 + D PO) Take by mouth.   Yes [provider]  Cholecalciferol (VITAMIN D3) 5000 units CAPS Take by mouth.   Yes [provider]  fexofenadine (ALLEGRA) 60 MG tablet Take 1 tablet (60 mg total) by mouth 2 (two) times daily. 06/15/17  Yes Glean Hess, MD  fluticasone [facility]) 50 MCG/ACT nasal spray Place 2 sprays into the nose daily. 04/13/14  Yes [provider]  Fluticasone-Salmeterol (ADVAIR DISKUS) 250-50 MCG/DOSE AEPB Inhale 1 puff into the lungs 2 (two) times daily. 06/15/17  Yes Glean Hess, MD  montelukast (SINGULAIR) 10 MG tablet Take 1 tablet (10 mg total) by mouth daily. 06/15/17 06/15/18 Yes Glean Hess, MD  Probiotic Product (PROBIOTIC ADVANCED PO) Take by mouth.   Yes [provider]    Allergies  Allergen Reactions  . Diazepam     Other reaction(s): pruritis  . Diflucan [Fluconazole]   . Metronidazole     Other reaction(s): Angioedema  . Sulfa Antibiotics Hives  . Zyrtec  [Cetirizine]     Other reaction(s): Hives  . Latex Rash  Past Surgical History:  Procedure Laterality Date  . BUNIONECTOMY Bilateral 2013, 2014  . COLPOSCOPY  2011    Social History   Tobacco Use  . Smoking status: Never Smoker  . Smokeless tobacco: Never Used  Substance Use Topics  . Alcohol use: Yes    Alcohol/week: 0.0 oz    Comment: social  . Drug use:  No     Medication list has been reviewed and updated.  PHQ 2/9 Scores 12/15/2017 06/29/2016 06/01/2016 05/07/2016  PHQ - 2 Score 0 2 6 0  PHQ- 9 Score 0 7 22 -    Physical Exam  Constitutional: She is oriented to person, place, and time. She appears well-developed. No distress.  HENT:  Head: Normocephalic and atraumatic.  Neck: Normal range of motion. Neck supple.  Cardiovascular: Normal rate, regular rhythm and normal heart sounds.  Pulmonary/Chest: Effort normal and breath sounds normal. No respiratory distress. She has no wheezes.  Musculoskeletal: Normal range of motion. She exhibits no edema or tenderness.  Neurological: She is alert and oriented to person, place, and time.  Skin: Skin is warm and dry. No rash noted.  Psychiatric: She has a normal mood and affect. Her behavior is normal. Thought content normal.  Nursing note and vitals reviewed.   BP 122/78   Pulse 82   Ht 5' 7"  (1.702 m)   Wt 209 lb (94.8 kg)   LMP 12/10/2017 (Exact Date)   SpO2 99%   BMI 32.73 kg/m   Assessment and Plan: 1. Encounter for physical examination related to employment Form completed  2. Mild intermittent asthma without complication Will increase Advair to 500-50 to address inflammation/cough - albuterol (ACCUNEB) 1.25 MG/3ML nebulizer solution; Take 3 mLs (1.25 mg total) by nebulization every 6 (six) hours as needed for wheezing.  Dispense: 90 mL; Refill: 3 - Fluticasone-Salmeterol (ADVAIR DISKUS) 500-50 MCG/DOSE AEPB; Inhale 1 puff into the lungs 2 (two) times daily.  Dispense: 60 each; Refill: 12 - Respiratory Therapy Supplies (NEBULIZER/TUBING/MOUTHPIECE) KIT; 1 each by Does not apply route daily.  Dispense: 1 each; Refill: 0  3. Depression, major, single episode, in partial remission ([place]) Doing well off of medication.   Meds ordered this encounter  Medications  . albuterol (ACCUNEB) 1.25 MG/3ML nebulizer solution    Sig: Take 3 mLs (1.25 mg total) by nebulization every 6 (six)  hours as needed for wheezing.    Dispense:  90 mL    Refill:  3  . Fluticasone-Salmeterol (ADVAIR DISKUS) 500-50 MCG/DOSE AEPB    Sig: Inhale 1 puff into the lungs 2 (two) times daily.    Dispense:  60 each    Refill:  12  . Respiratory Therapy Supplies (NEBULIZER/TUBING/MOUTHPIECE) KIT    Sig: 1 each by Does not apply route daily.    Dispense:  1 each    Refill:  0    Partially dictated using Editor, commissioning. Any errors are unintentional.  Halina Maidens, MD [facility]  12/15/2017

## 2018-05-30 ENCOUNTER — Encounter: Payer: Self-pay | Admitting: Internal Medicine

## 2018-05-30 ENCOUNTER — Ambulatory Visit: Payer: Medicaid Other | Admitting: Internal Medicine

## 2018-05-30 VITALS — BP 140/96 | HR 95 | Temp 98.6°F | Ht 67.0 in | Wt 200.0 lb

## 2018-05-30 DIAGNOSIS — M7522 Bicipital tendinitis, left shoulder: Secondary | ICD-10-CM | POA: Diagnosis not present

## 2018-05-30 MED ORDER — PREDNISONE 10 MG PO TABS
ORAL_TABLET | ORAL | 0 refills | Status: DC
Start: 2018-05-30 — End: 2019-03-06

## 2018-05-30 MED ORDER — TRAMADOL HCL 50 MG PO TABS: 50.0000 mg | ORAL_TABLET | Freq: Every evening | ORAL | 0 refills | Status: DC | PRN

## 2018-05-30 NOTE — Progress Notes (Signed)
Date:  05/30/2018   Name:  Beverly Thomas   DOB:  August 18, 1981   MRN:  [id number]   Chief Complaint: Arm Problem (left arm, biceps, 6 weeks ) Arm Pain   There was no injury mechanism. The pain is present in the left shoulder and left forearm. The quality of the pain is described as shooting and stabbing. The pain is moderate. Pertinent negatives include no chest pain or numbness. She has tried NSAIDs (cortisone) for the symptoms.  had cortisone first 6 weeks ago which helped significantly but then pain recurred.  She then had flexeril and mobic with no improvement.  Pain continues to worsen.    Review of Systems  Constitutional: Negative for chills and fever.  Respiratory: Negative for choking and shortness of breath.   Cardiovascular: Negative for chest pain.  Musculoskeletal: Positive for myalgias (severe left upper arm pain).  Neurological: Negative for weakness and numbness.    Patient Active Problem List   Diagnosis Date Noted  . Cyst, sebaceous, breast 06/29/2016  . H/O abnormal cervical Papanicolaou smear 05/27/2015  . Angio-edema 05/27/2015  . Allergic state 05/27/2015  . Depression, major, single episode, in partial remission ([place]) 05/27/2015  . Asthma, mild intermittent 05/27/2015  . Back muscle spasm 05/27/2015  . Avitaminosis D 05/27/2015    Prior to Admission medications   Medication Sig Start Date End Date Taking? Authorizing Provider  albuterol (ACCUNEB) 1.25 MG/3ML nebulizer solution Take 3 mLs (1.25 mg total) by nebulization every 6 (six) hours as needed for wheezing. 12/15/17  Yes Glean Hess, MD  albuterol (PROVENTIL HFA;VENTOLIN HFA) 108 (90 Base) MCG/ACT inhaler Inhale 2 puffs into the lungs every 6 (six) hours as needed for wheezing or shortness of breath. 05/13/17  Yes Juline Patch, MD  b complex vitamins tablet Take 1 tablet by mouth daily.   Yes [provider]  Calcium Carb-Cholecalciferol (CALCIUM 1000 + D PO) Take by mouth.   Yes  [provider]  Cholecalciferol (VITAMIN D3) 5000 units CAPS Take by mouth.   Yes [provider]  cyclobenzaprine (FLEXERIL) 5 MG tablet TK 1 T PO TID PRN 05/02/18  Yes [provider]  fexofenadine (ALLEGRA) 60 MG tablet Take 1 tablet (60 mg total) by mouth 2 (two) times daily. 06/15/17  Yes Glean Hess, MD  fluticasone [facility]) 50 MCG/ACT nasal spray Place 2 sprays into the nose daily. 04/13/14  Yes [provider]  Fluticasone-Salmeterol (ADVAIR DISKUS) 500-50 MCG/DOSE AEPB Inhale 1 puff into the lungs 2 (two) times daily. 12/15/17  Yes Glean Hess, MD  montelukast (SINGULAIR) 10 MG tablet Take 1 tablet (10 mg total) by mouth daily. 06/15/17 06/15/18 Yes Glean Hess, MD  Respiratory Therapy Supplies (NEBULIZER/TUBING/MOUTHPIECE) KIT 1 each by Does not apply route daily. 12/15/17  Yes Glean Hess, MD    Allergies  Allergen Reactions  . Diazepam     Other reaction(s): pruritis  . Diflucan [Fluconazole]   . Metronidazole     Other reaction(s): Angioedema  . Sulfa Antibiotics Hives  . Zyrtec  [Cetirizine]     Other reaction(s): Hives  . Latex Rash    Past Surgical History:  Procedure Laterality Date  . BUNIONECTOMY Bilateral 2013, 2014  . COLPOSCOPY  2011    Social History   Tobacco Use  . Smoking status: Never Smoker  . Smokeless tobacco: Never Used  Substance Use Topics  . Alcohol use: Yes    Alcohol/week: 0.0 oz  Comment: social  . Drug use: No     Medication list has been reviewed and updated.  Current Meds  Medication Sig  . albuterol (ACCUNEB) 1.25 MG/3ML nebulizer solution Take 3 mLs (1.25 mg total) by nebulization every 6 (six) hours as needed for wheezing.  Marland Kitchen albuterol (PROVENTIL HFA;VENTOLIN HFA) 108 (90 Base) MCG/ACT inhaler Inhale 2 puffs into the lungs every 6 (six) hours as needed for wheezing or shortness of breath.  Marland Kitchen b complex vitamins tablet Take 1 tablet by mouth daily.  . Calcium  Carb-Cholecalciferol (CALCIUM 1000 + D PO) Take by mouth.  . Cholecalciferol (VITAMIN D3) 5000 units CAPS Take by mouth.  . cyclobenzaprine (FLEXERIL) 5 MG tablet TK 1 T PO TID PRN  . fexofenadine (ALLEGRA) 60 MG tablet Take 1 tablet (60 mg total) by mouth 2 (two) times daily.  . fluticasone (FLONASE) 50 MCG/ACT nasal spray Place 2 sprays into the nose daily.  . Fluticasone-Salmeterol (ADVAIR DISKUS) 500-50 MCG/DOSE AEPB Inhale 1 puff into the lungs 2 (two) times daily.  . montelukast (SINGULAIR) 10 MG tablet Take 1 tablet (10 mg total) by mouth daily.  Marland Kitchen Respiratory Therapy Supplies (NEBULIZER/TUBING/MOUTHPIECE) KIT 1 each by Does not apply route daily.  . [DISCONTINUED] Probiotic Product (PROBIOTIC ADVANCED PO) Take by mouth.   Current Facility-Administered Medications for the 05/30/18 encounter (Office Visit) with Glean Hess, MD  Medication  . ipratropium-albuterol (DUONEB) 0.5-2.5 (3) MG/3ML nebulizer solution 3 mL    PHQ 2/9 Scores 12/15/2017 06/29/2016 06/01/2016 05/07/2016  PHQ - 2 Score 0 2 6 0  PHQ- 9 Score 0 7 22 -    Physical Exam  Constitutional: She is oriented to person, place, and time. She appears well-developed. She appears distressed (crying due to arm pain).  HENT:  Head: Normocephalic and atraumatic.  Neck: Normal range of motion. Neck supple.  Cardiovascular: Normal rate, regular rhythm and normal heart sounds.  Pulmonary/Chest: Effort normal and breath sounds normal. No respiratory distress.  Musculoskeletal: Normal range of motion.       Left shoulder: She exhibits tenderness, pain and spasm.       Left elbow: She exhibits normal range of motion, no swelling and no effusion.  Neurological: She is alert and oriented to person, place, and time. She has normal strength. No sensory deficit.  Skin: Skin is warm and dry. No rash noted.  Psychiatric: She has a normal mood and affect. Her behavior is normal. Thought content normal.  Nursing note and vitals  reviewed.   BP (!) 140/96   Pulse 95   Temp 98.6 F (37 C) (Oral)   Ht '5\' 7"'$  (1.702 m)   Wt 200 lb (90.7 kg)   LMP 05/27/2018   SpO2 100%   BMI 31.32 kg/m   Assessment and Plan: 1. Biceps tendonitis, left Refer to Ortho Tramadol for pain at night - predniSONE (DELTASONE) 10 MG tablet; Take 6 on day 1, 5 on day 2, 4 on day 3, 3 on day 4, 2 on day 5 and 1 on day 1 then stop.  Dispense: 21 tablet; Refill: 0 - traMADol (ULTRAM) 50 MG tablet; Take 1 tablet (50 mg total) by mouth at bedtime as needed.  Dispense: 7 tablet; Refill: 0 - Ambulatory referral to Orthopedic Surgery   Meds ordered this encounter  Medications  . predniSONE (DELTASONE) 10 MG tablet    Sig: Take 6 on day 1, 5 on day 2, 4 on day 3, 3 on day 4, 2 on day 5  and 1 on day 1 then stop.    Dispense:  21 tablet    Refill:  0  . traMADol (ULTRAM) 50 MG tablet    Sig: Take 1 tablet (50 mg total) by mouth at bedtime as needed.    Dispense:  7 tablet    Refill:  0    Partially dictated using Editor, commissioning. Any errors are unintentional.  Halina Maidens, MD [facility]  05/30/2018   There are no diagnoses linked to this encounter.

## 2018-06-15 DIAGNOSIS — M5412 Radiculopathy, cervical region: Secondary | ICD-10-CM | POA: Diagnosis not present

## 2018-06-15 DIAGNOSIS — E66811 Obesity, class 1: Secondary | ICD-10-CM | POA: Insufficient documentation

## 2018-06-15 DIAGNOSIS — E669 Obesity, unspecified: Secondary | ICD-10-CM | POA: Diagnosis not present

## 2018-07-08 ENCOUNTER — Ambulatory Visit: Payer: Medicaid Other | Admitting: Internal Medicine

## 2018-07-08 DIAGNOSIS — M5412 Radiculopathy, cervical region: Secondary | ICD-10-CM | POA: Diagnosis not present

## 2018-07-08 DIAGNOSIS — M79602 Pain in left arm: Secondary | ICD-10-CM | POA: Diagnosis not present

## 2018-07-22 DIAGNOSIS — M79602 Pain in left arm: Secondary | ICD-10-CM | POA: Diagnosis not present

## 2018-07-22 DIAGNOSIS — M5412 Radiculopathy, cervical region: Secondary | ICD-10-CM | POA: Diagnosis not present

## 2018-11-25 DIAGNOSIS — O3680X0 Pregnancy with inconclusive fetal viability, not applicable or unspecified: Secondary | ICD-10-CM | POA: Diagnosis not present

## 2018-11-25 DIAGNOSIS — E669 Obesity, unspecified: Secondary | ICD-10-CM | POA: Diagnosis not present

## 2018-11-25 DIAGNOSIS — O0992 Supervision of high risk pregnancy, unspecified, second trimester: Secondary | ICD-10-CM | POA: Insufficient documentation

## 2018-11-25 DIAGNOSIS — Z3481 Encounter for supervision of other normal pregnancy, first trimester: Secondary | ICD-10-CM | POA: Diagnosis not present

## 2018-11-25 LAB — OB RESULTS CONSOLE ABO/RH: RH Type: POSITIVE

## 2018-11-25 LAB — OB RESULTS CONSOLE HIV ANTIBODY (ROUTINE TESTING): HIV: NONREACTIVE

## 2018-11-25 LAB — OB RESULTS CONSOLE GC/CHLAMYDIA
Chlamydia: NEGATIVE
Gonorrhea: NEGATIVE

## 2018-11-25 LAB — OB RESULTS CONSOLE ANTIBODY SCREEN: Antibody Screen: NEGATIVE

## 2018-11-25 LAB — OB RESULTS CONSOLE HEPATITIS B SURFACE ANTIGEN: Hepatitis B Surface Ag: NEGATIVE

## 2018-11-25 LAB — OB RESULTS CONSOLE GBS: GBS: POSITIVE

## 2018-11-25 LAB — OB RESULTS CONSOLE RPR: RPR: NONREACTIVE

## 2018-11-25 LAB — OB RESULTS CONSOLE RUBELLA ANTIBODY, IGM: Rubella: IMMUNE

## 2018-11-29 DIAGNOSIS — O3680X0 Pregnancy with inconclusive fetal viability, not applicable or unspecified: Secondary | ICD-10-CM | POA: Diagnosis not present

## 2018-11-29 DIAGNOSIS — Z3481 Encounter for supervision of other normal pregnancy, first trimester: Secondary | ICD-10-CM | POA: Diagnosis not present

## 2018-12-16 ENCOUNTER — Encounter: Payer: Medicaid Other | Admitting: Internal Medicine

## 2018-12-21 NOTE — L&D Delivery Note (Signed)
Delivery Note At 3:23 AM a viable female was delivered via Vaginal, Spontaneous (Presentation: ROA).  APGAR: 8, 9; weight pending.   Placenta status: delivered spontaneously and completely.  Cord: 3vessel with the following complications: n/a.   Anesthesia:  n/a Episiotomy: None Lacerations: 1st degree;Perineal Suture Repair: 3.0 vicryl rapide Est. Blood Loss (mL):  QBL pending, estimate 249ml   Mom to postpartum.  Baby to Couplet care / Skin to Skin.  Marikay Alar 06/18/2019, 4:00 AM

## 2018-12-27 DIAGNOSIS — Z87440 Personal history of urinary (tract) infections: Secondary | ICD-10-CM | POA: Diagnosis not present

## 2018-12-31 ENCOUNTER — Emergency Department
Admission: EM | Admit: 2018-12-31 | Discharge: 2018-12-31 | Disposition: A | Discharge status: Home / Self Care | Payer: Medicaid Other | Source: Home / Self Care | Attending: Emergency Medicine | Admitting: Emergency Medicine

## 2018-12-31 ENCOUNTER — Observation Stay
Admission: EM | Admit: 2018-12-31 | Discharge: 2019-01-01 | Disposition: A | Discharge status: Home / Self Care | Payer: Medicaid Other | Attending: Obstetrics and Gynecology | Admitting: Obstetrics and Gynecology

## 2018-12-31 ENCOUNTER — Encounter: Payer: Self-pay | Admitting: Emergency Medicine

## 2018-12-31 ENCOUNTER — Other Ambulatory Visit: Payer: Self-pay

## 2018-12-31 ENCOUNTER — Emergency Department: Payer: Medicaid Other

## 2018-12-31 DIAGNOSIS — J4520 Mild intermittent asthma, uncomplicated: Secondary | ICD-10-CM

## 2018-12-31 DIAGNOSIS — Z91040 Latex allergy status: Secondary | ICD-10-CM

## 2018-12-31 DIAGNOSIS — G43909 Migraine, unspecified, not intractable, without status migrainosus: Secondary | ICD-10-CM | POA: Insufficient documentation

## 2018-12-31 DIAGNOSIS — Z79899 Other long term (current) drug therapy: Secondary | ICD-10-CM | POA: Insufficient documentation

## 2018-12-31 DIAGNOSIS — O9989 Other specified diseases and conditions complicating pregnancy, childbirth and the puerperium: Secondary | ICD-10-CM | POA: Diagnosis not present

## 2018-12-31 DIAGNOSIS — R51 Headache: Principal | ICD-10-CM

## 2018-12-31 DIAGNOSIS — O99512 Diseases of the respiratory system complicating pregnancy, second trimester: Secondary | ICD-10-CM | POA: Insufficient documentation

## 2018-12-31 DIAGNOSIS — Z3A15 15 weeks gestation of pregnancy: Secondary | ICD-10-CM | POA: Insufficient documentation

## 2018-12-31 DIAGNOSIS — R519 Headache, unspecified: Secondary | ICD-10-CM

## 2018-12-31 DIAGNOSIS — O26892 Other specified pregnancy related conditions, second trimester: Principal | ICD-10-CM | POA: Insufficient documentation

## 2018-12-31 HISTORY — DX: Unspecified asthma, uncomplicated: J45.909

## 2018-12-31 LAB — COMPREHENSIVE METABOLIC PANEL
ALT: 16 U/L (ref 0–44)
AST: 19 U/L (ref 15–41)
Albumin: 3.4 g/dL — ABNORMAL LOW (ref 3.5–5.0)
Alkaline Phosphatase: 37 U/L — ABNORMAL LOW (ref 38–126)
Anion gap: 7 (ref 5–15)
CO2: 23 mmol/L (ref 22–32)
Calcium: 8.5 mg/dL — ABNORMAL LOW (ref 8.9–10.3)
Chloride: 106 mmol/L (ref 98–111)
Creatinine, Ser: 0.51 mg/dL (ref 0.44–1.00)
GFR calc Af Amer: >60 mL/min (ref >60)
Glucose, Bld: 84 mg/dL (ref 70–99)
POTASSIUM: 3.6 mmol/L (ref 3.5–5.1)
Sodium: 136 mmol/L (ref 135–145)
TOTAL PROTEIN: 6.4 g/dL — AB (ref 6.5–8.1)
Total Bilirubin: 0.4 mg/dL (ref 0.3–1.2)

## 2018-12-31 LAB — URINE DRUG SCREEN, QUALITATIVE (ARMC ONLY)
Amphetamines, Ur Screen: NOT DETECTED
BARBITURATES, UR SCREEN: POSITIVE — AB
BENZODIAZEPINE, UR SCRN: NOT DETECTED
CANNABINOID 50 NG, UR ~~LOC~~: NOT DETECTED
Cocaine Metabolite,Ur ~~LOC~~: NOT DETECTED
MDMA (Ecstasy)Ur Screen: NOT DETECTED
Methadone Scn, Ur: NOT DETECTED
Opiate, Ur Screen: NOT DETECTED
PHENCYCLIDINE (PCP) UR S: NOT DETECTED
TRICYCLIC, UR SCREEN: NOT DETECTED

## 2018-12-31 LAB — CBC WITH DIFFERENTIAL/PLATELET
Abs Immature Granulocytes: 0.02 10*3/uL (ref 0.00–0.07)
BASOS ABS: 0 10*3/uL (ref 0.0–0.1)
BASOS PCT: 1 %
EOS ABS: 0.2 10*3/uL (ref 0.0–0.5)
Eosinophils Relative: 3 %
HCT: 34.1 % — ABNORMAL LOW (ref 36.0–46.0)
Hemoglobin: 11.3 g/dL — ABNORMAL LOW (ref 12.0–15.0)
IMMATURE GRANULOCYTES: 0 %
Lymphocytes Relative: 24 %
Lymphs Abs: 1.6 10*3/uL (ref 0.7–4.0)
MCH: 28 pg (ref 26.0–34.0)
MCHC: 33.1 g/dL (ref 30.0–36.0)
MCV: 84.4 fL (ref 80.0–100.0)
MONOS PCT: 10 %
Monocytes Absolute: 0.6 10*3/uL (ref 0.1–1.0)
NEUTROS PCT: 62 %
NRBC: 0 % (ref 0.0–0.2)
Neutro Abs: 4.1 10*3/uL (ref 1.7–7.7)
Platelets: 252 10*3/uL (ref 150–400)
RBC: 4.04 MIL/uL (ref 3.87–5.11)
RDW: 13.5 % (ref 11.5–15.5)
WBC: 6.7 10*3/uL (ref 4.0–10.5)

## 2018-12-31 LAB — URINALYSIS, COMPLETE (UACMP) WITH MICROSCOPIC
BILIRUBIN URINE: NEGATIVE
GLUCOSE, UA: NEGATIVE mg/dL
HGB URINE DIPSTICK: NEGATIVE
KETONES UR: NEGATIVE mg/dL
NITRITE: NEGATIVE
PROTEIN: NEGATIVE mg/dL
Specific Gravity, Urine: 1.004 — ABNORMAL LOW (ref 1.005–1.030)
pH: 7 (ref 5.0–8.0)

## 2018-12-31 MED ORDER — PRENATAL MULTIVITAMIN CH: 1.0000 | ORAL_TABLET | Freq: Every day | ORAL | Status: DC

## 2018-12-31 MED ORDER — DOCUSATE SODIUM 100 MG PO CAPS: 100.0000 mg | ORAL_CAPSULE | Freq: Every day | ORAL | Status: DC

## 2018-12-31 MED ORDER — ACETAMINOPHEN 500 MG PO TABS
1000.0000 mg | ORAL_TABLET | Freq: Once | ORAL | Status: AC
  Administered 2018-12-31: 1000 mg via ORAL
  Filled 2018-12-31: qty 2

## 2018-12-31 MED ORDER — PROCHLORPERAZINE EDISYLATE 10 MG/2ML IJ SOLN
10.0000 mg | Freq: Once | INTRAMUSCULAR | Status: DC
  Filled 2018-12-31: qty 2

## 2018-12-31 MED ORDER — METOCLOPRAMIDE HCL 5 MG/ML IJ SOLN
10.0000 mg | Freq: Once | INTRAMUSCULAR | Status: AC
  Administered 2018-12-31: 10 mg via INTRAVENOUS
  Filled 2018-12-31: qty 2

## 2018-12-31 MED ORDER — MAGNESIUM SULFATE IN D5W 1-5 GM/100ML-% IV SOLN
1.0000 g | Freq: Once | INTRAVENOUS | Status: AC
  Administered 2018-12-31: 1 g via INTRAVENOUS
  Filled 2018-12-31: qty 100

## 2018-12-31 MED ORDER — LACTATED RINGERS IV SOLN
INTRAVENOUS | Status: DC
  Administered 2018-12-31: via INTRAVENOUS

## 2018-12-31 MED ORDER — KETOROLAC TROMETHAMINE 30 MG/ML IJ SOLN
30.0000 mg | Freq: Once | INTRAMUSCULAR | Status: AC
  Administered 2018-12-31: 30 mg via INTRAVENOUS
  Filled 2018-12-31: qty 1

## 2018-12-31 MED ORDER — SODIUM CHLORIDE 0.9 % IV BOLUS
1000.0000 mL | Freq: Once | INTRAVENOUS | Status: AC
  Administered 2018-12-31: 1000 mL via INTRAVENOUS

## 2018-12-31 MED ORDER — CALCIUM CARBONATE ANTACID 500 MG PO CHEW: 2.0000 | CHEWABLE_TABLET | ORAL | Status: DC | PRN

## 2018-12-31 MED ORDER — ZOLPIDEM TARTRATE 5 MG PO TABS
5.0000 mg | ORAL_TABLET | Freq: Every evening | ORAL | Status: DC | PRN
  Administered 2019-01-01: 5 mg via ORAL
  Filled 2018-12-31: qty 1

## 2018-12-31 MED ORDER — ACETAMINOPHEN 325 MG PO TABS: 650.0000 mg | ORAL_TABLET | ORAL | Status: DC | PRN

## 2018-12-31 MED ORDER — DIPHENHYDRAMINE HCL 50 MG/ML IJ SOLN
50.0000 mg | Freq: Once | INTRAMUSCULAR | Status: AC
  Administered 2018-12-31: 50 mg via INTRAVENOUS
  Filled 2018-12-31: qty 1

## 2018-12-31 NOTE — ED Notes (Signed)
Patient is upset while waiting for triage. States "I'm not supposed to be waiting, I have already talked to L&D". Patient told we have a triage to complete but she called upstairs and told somebody to come down here and get her because she "ain't trying to wait". Asked her what she was told from upstairs and she stated they were coming to get her. She does not feel like she needs to be triaged. Dr. Bernestine Amass called and spoke to [facility] and told her to go ahead and send her up.

## 2018-12-31 NOTE — OB Triage Note (Signed)
Pt is 37y/o G1P0 at [redacted]w[redacted]d.  Pt states headache began about 45 min ago rating it 7/10. Pt describes pain as constant and throbbing. Pt denies visual changes. PT denies RUQ.

## 2018-12-31 NOTE — ED Notes (Signed)
Pt to MRI transported by [facility], Charity fundraiser.

## 2018-12-31 NOTE — ED Notes (Signed)
ED Provider at bedside. 

## 2018-12-31 NOTE — OB Triage Note (Signed)
TRIAGE VISIT    Beverly Thomas is a 38 y.o. G1P0. She is at [redacted]w[redacted]d gestation.  HPI: Migraine, previously treated in the ER today with IV meds, normal head and brain MRI. No prior headaches before pregnancy. No neurological deficits.   S: In moderate distress. Photophobia, phonophobia.  O:  BP 121/79 (BP Location: Left Arm)   Pulse 81   Temp 98.2 F (36.8 C) (Oral)   Resp 20   Ht 5\' 7"  (1.702 m)   LMP 05/27/2018   BMI 31.95 kg/m  Results for orders placed or performed during the hospital encounter of 12/31/18 (from the past 48 hour(s))  Urinalysis, Complete w Microscopic   Collection Time: 12/31/18  7:22 AM  Result Value Ref Range   Color, Urine STRAW (A) YELLOW   APPearance CLEAR (A) CLEAR   Specific Gravity, Urine 1.004 (L) 1.005 - 1.030   pH 7.0 5.0 - 8.0   Glucose, UA NEGATIVE NEGATIVE mg/dL   Hgb urine dipstick NEGATIVE NEGATIVE   Bilirubin Urine NEGATIVE NEGATIVE   Ketones, ur NEGATIVE NEGATIVE mg/dL   Protein, ur NEGATIVE NEGATIVE mg/dL   Nitrite NEGATIVE NEGATIVE   Leukocytes, UA SMALL (A) NEGATIVE   WBC, UA 0-5 0 - 5 WBC/hpf   Bacteria, UA RARE (A) NONE SEEN   Squamous Epithelial / LPF 0-5 0 - 5  Urine Drug Screen, Qualitative   Collection Time: 12/31/18  7:22 AM  Result Value Ref Range   Tricyclic, Ur Screen NONE DETECTED NONE DETECTED   Amphetamines, Ur Screen NONE DETECTED NONE DETECTED   MDMA (Ecstasy)Ur Screen NONE DETECTED NONE DETECTED   Cocaine Metabolite,Ur [place] NONE DETECTED NONE DETECTED   Opiate, Ur Screen NONE DETECTED NONE DETECTED   Phencyclidine (PCP) Ur S NONE DETECTED NONE DETECTED   Cannabinoid 50 Ng, Ur [place] NONE DETECTED NONE DETECTED   Barbiturates, Ur Screen POSITIVE (A) NONE DETECTED   Benzodiazepine, Ur Scrn NONE DETECTED NONE DETECTED   Methadone Scn, Ur NONE DETECTED NONE DETECTED  CBC with Differential   Collection Time: 12/31/18  8:00 AM  Result Value Ref Range   WBC 6.7 4.0 - 10.5 K/uL   RBC 4.04 3.87 - 5.11 MIL/uL   Hemoglobin  11.3 (L) 12.0 - 15.0 g/dL   HCT 34.2 (L) 87.6 - 81.1 %   MCV 84.4 80.0 - 100.0 fL   MCH 28.0 26.0 - 34.0 pg   MCHC 33.1 30.0 - 36.0 g/dL   RDW 57.2 62.0 - 35.5 %   Platelets 252 150 - 400 K/uL   nRBC 0.0 0.0 - 0.2 %   Neutrophils Relative % 62 %   Neutro Abs 4.1 1.7 - 7.7 K/uL   Lymphocytes Relative 24 %   Lymphs Abs 1.6 0.7 - 4.0 K/uL   Monocytes Relative 10 %   Monocytes Absolute 0.6 0.1 - 1.0 K/uL   Eosinophils Relative 3 %   Eosinophils Absolute 0.2 0.0 - 0.5 K/uL   Basophils Relative 1 %   Basophils Absolute 0.0 0.0 - 0.1 K/uL   Immature Granulocytes 0 %   Abs Immature Granulocytes 0.02 0.00 - 0.07 K/uL  Comprehensive metabolic panel   Collection Time: 12/31/18  8:00 AM  Result Value Ref Range   Sodium 136 135 - 145 mmol/L   Potassium 3.6 3.5 - 5.1 mmol/L   Chloride 106 98 - 111 mmol/L   CO2 23 22 - 32 mmol/L   Glucose, Bld 84 70 - 99 mg/dL   BUN <5 (L) 6 - 20  mg/dL   Creatinine, Ser 6.38 0.44 - 1.00 mg/dL   Calcium 8.5 (L) 8.9 - 10.3 mg/dL   Total Protein 6.4 (L) 6.5 - 8.1 g/dL   Albumin 3.4 (L) 3.5 - 5.0 g/dL   AST 19 15 - 41 U/L   ALT 16 0 - 44 U/L   Alkaline Phosphatase 37 (L) 38 - 126 U/L   Total Bilirubin 0.4 0.3 - 1.2 mg/dL   GFR calc non Af Amer >60 >60 mL/min   GFR calc Af Amer >60 >60 mL/min   Anion gap 7 5 - 15     Gen: NAD, AAOx3      Abd: FNTTP      Ext: Non-tender, Nonedmeatous    FHT: with bedside Doppler 160 bpmt   A/P:  38 y.o. G1P0 [redacted]w[redacted]d with migraines in pregnancy.   Head imaging reassuring earlier today.  - ivf - mag sulfate 1giv over 2 hrs + toradol 20mg  iv x1 +compazine 10mg  iv x1 ordered - confirmed fetal heart tones - will augment with additional meds if needed - continual patient monitoring in triage

## 2018-12-31 NOTE — ED Triage Notes (Signed)
Patient c/o intermittent headaches X 1 week. Patient took Fioricet last night for pain with no relief. Patient reports she is [redacted] weeks pregnant.

## 2018-12-31 NOTE — ED Provider Notes (Addendum)
[facility] Emergency Department Provider Note  ____________________________________________   I have reviewed the triage vital signs and the nursing notes. Where available I have reviewed prior notes and, if possible and indicated, outside hospital notes.    HISTORY  Chief Complaint Headache    HPI Beverly Thomas is a 38 y.o. female who presents today complaining of a headache.  Patient has a history of multiple allergies to multiple medications, asthma, back pain.  Patient has never had any history in herself or her family of clotting disorder.  She is [redacted] weeks pregnant with a normal ultrasound already obtained as an outpatient.  She is complaining of a gradual onset headache which is been there for 2 weeks.  Not 1 week with 2.  She has seen her OB for this.  They took her out of work on Wednesday.  They also gave her Fioricet which does not help she is tried Tylenol which has not helped.  She does not always have a headache, and comes and goes.  Is on the right side, is associate with photophobia.  No scotoma or aura.  She denies any fever or chills or stiff neck.  She is drinking okay.  She has nausea but no vomiting.  She denies any change in vision hearing or difficulty talking or walking or any focal symptoms.  She has no stiff neck.  Headache was gradual in onset.  She does not typically get headaches.  Her daughter does have migraines however.  Otherwise no known family history.  No family history of clotting disorders.  She states that she has had pain that comes and goes.  Is not there every day but most days.  She cannot predict what can make it better or worse.  It does seem significant when it is there.  At this time she woke up with it again today.  She is trying to take p.o. liquids with minimal relief at home.  Was not thunderclap at onset.  It is been very gradually progressive.  She does not have a history of migraines herself she states no other prior  treatment alleviating factors or other complications.  Denies change in vision.    Past Medical History:  Diagnosis Date  . Allergy     Patient Active Problem List   Diagnosis Date Noted  . Cyst, sebaceous, breast 06/29/2016  . H/O abnormal cervical Papanicolaou smear 05/27/2015  . Angio-edema 05/27/2015  . Allergic state 05/27/2015  . 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 Asthma, mild intermittent 05/27/2015  . Back muscle spasm 05/27/2015  . Avitaminosis D 05/27/2015    Past Surgical History:  Procedure Laterality Date  . BUNIONECTOMY Bilateral 2013, 2014  . COLPOSCOPY  2011    Prior to Admission medications   Medication Sig Start Date End Date Taking? Authorizing Provider  albuterol (ACCUNEB) 1.25 MG/3ML nebulizer solution Take 3 mLs (1.25 mg total) by nebulization every 6 (six) hours as needed for wheezing. 12/15/17   Glean Hess, MD  albuterol (PROVENTIL HFA;VENTOLIN HFA) 108 (90 Base) MCG/ACT inhaler Inhale 2 puffs into the lungs every 6 (six) hours as needed for wheezing or shortness of breath. 05/13/17   Juline Patch, MD  b complex vitamins tablet Take 1 tablet by mouth daily.    [provider]  Calcium Carb-Cholecalciferol (CALCIUM 1000 + D PO) Take by mouth.    [provider]  Cholecalciferol (VITAMIN D3) 5000 units CAPS Take by mouth.  [provider]  cyclobenzaprine (FLEXERIL) 5 MG tablet TK 1 T PO TID PRN 05/02/18   [provider]  fexofenadine (ALLEGRA) 60 MG tablet Take 1 tablet (60 mg total) by mouth 2 (two) times daily. 06/15/17   Glean Hess, MD  fluticasone Asencion Islam) 50 MCG/ACT nasal spray Place 2 sprays into the nose daily. 04/13/14   [provider]  Fluticasone-Salmeterol (ADVAIR DISKUS) 500-50 MCG/DOSE AEPB Inhale 1 puff into the lungs 2 (two) times daily. 12/15/17   Glean Hess, MD  montelukast (SINGULAIR) 10 MG tablet Take 1 tablet (10 mg total) by  mouth daily. 06/15/17 06/15/18  Glean Hess, MD  predniSONE (DELTASONE) 10 MG tablet Take 6 on day 1, 5 on day 2, 4 on day 3, 3 on day 4, 2 on day 5 and 1 on day 1 then stop. 05/30/18   Glean Hess, MD  Respiratory Therapy Supplies (NEBULIZER/TUBING/MOUTHPIECE) KIT 1 each by Does not apply route daily. 12/15/17   Glean Hess, MD  traMADol (ULTRAM) 50 MG tablet Take 1 tablet (50 mg total) by mouth at bedtime as needed. 05/30/18   Glean Hess, MD    Allergies Diazepam; Diflucan [fluconazole]; Metronidazole; Sulfa antibiotics; Zyrtec  [cetirizine]; and Latex  Family History  Problem Relation Age of Onset  . Diabetes Mother   . Hypertension Mother   . Breast cancer Maternal Aunt        great aunt    Social History Social History   Tobacco Use  . Smoking status: Never Smoker  . Smokeless tobacco: Never Used  Substance Use Topics  . Alcohol use: Not Currently    Alcohol/week: 0.0 standard drinks    Comment: social  . Drug use: No    Review of Systems Constitutional: No fever/chills Eyes: No visual changes. ENT: No sore throat. No stiff neck no neck pain Cardiovascular: Denies chest pain. Respiratory: Denies shortness of breath. Gastrointestinal:   no vomiting.  No diarrhea.  No constipation. Genitourinary: Negative for dysuria. Musculoskeletal: Negative lower extremity swelling Skin: Negative for rash. Neurological see HPI regarding headaches, focal weakness or numbness.   ____________________________________________   PHYSICAL EXAM:  VITAL SIGNS: ED Triage Vitals  Enc Vitals Group     BP 12/31/18 0618 113/71     Pulse Rate 12/31/18 0618 75     Resp 12/31/18 0618 18     Temp 12/31/18 0618 97.9 F (36.6 C)     Temp src --      SpO2 12/31/18 0618 100 %     Weight 12/31/18 0619 204 lb (92.5 kg)     Height 12/31/18 0619 _0  (1.702 m)     Head Circumference --      Peak Flow --      Pain Score 12/31/18 0619 6     Pain Loc --      Pain Edu? --       Excl. in [place]? --     Constitutional: Alert and oriented. Well appearing and in no acute distress. Eyes: Conjunctivae are normal Head: Atraumatic HEENT: No congestion/rhinnorhea. Mucous membranes are moist.  Oropharynx non-erythematous Neck:   Nontender with no meningismus, no masses, no stridor Cardiovascular: Normal rate, regular rhythm. Grossly normal heart sounds.  Good peripheral circulation. Respiratory: Normal respiratory effort.  No retractions. Lungs CTAB. Abdominal: Soft and nontender. No distention. No guarding no rebound Back:  There is no focal tenderness or step off.  there is no midline tenderness there are no lesions  noted. there is no CVA tenderness Musculoskeletal: No lower extremity tenderness, no upper extremity tenderness. No joint effusions, no DVT signs strong distal pulses no edema Neurologic: Cranial nerves II through XII are grossly intact 5 out of 5 strength bilateral upper and lower extremity. Finger to nose within normal limits heel to shin within normal limits, speech is normal with no word finding difficulty or dysarthria, reflexes symmetric, pupils are equally round and reactive to light, there is no pronator drift, sensation is normal, vision is intact to confrontation, gait is deferred, there is no nystagmus, normal neurologic exam Skin:  Skin is warm, dry and intact. No rash noted. Psychiatric: Mood and affect are normal. Speech and behavior are normal.  ____________________________________________   LABS (all labs ordered are listed, but only abnormal results are displayed)  Labs Reviewed  CBC WITH DIFFERENTIAL/PLATELET  COMPREHENSIVE METABOLIC PANEL  URINALYSIS, COMPLETE (UACMP) WITH MICROSCOPIC  URINE DRUG SCREEN, QUALITATIVE ([place])    Pertinent labs  results that were available during my care of the patient were reviewed by me and considered in my medical decision making (see chart for  details). ____________________________________________  EKG  I personally interpreted any EKGs ordered by me or triage  ____________________________________________  RADIOLOGY  Pertinent labs & imaging results that were available during my care of the patient were reviewed by me and considered in my medical decision making (see chart for details). If possible, patient and/or family made aware of any abnormal findings.  No results found. ____________________________________________    PROCEDURES  Procedure(s) performed: None  Procedures  Critical Care performed: None  ____________________________________________   INITIAL IMPRESSION / ASSESSMENT AND PLAN / ED COURSE  Pertinent labs & imaging results that were available during my care of the patient were reviewed by me and considered in my medical decision making (see chart for details).  Patient here with a headache very reassuring exam very low suspicion for bleed gradual onset recurrent headaches over several weeks, not thunderclap not worst headache of life initially, however, she states that she does have no significant history of headaches, and therefore this is atypical for her.  Is not unusual to develop migraines in pregnancy and this is most likely a migraine however given her persistent symptoms for several weeks, we will obtain imaging to ensure that she does not have cavernous thrombosis or other pathology.  I do not think a CT scan, with its attendant radiation, is indicated because I do not think she has an acute bleed, and otherwise would be better served with MRI.  We will give her medication which should be fine in pregnancy to see if we can help her symptoms, while we make sure that there is no other significant cause.  However, neurologically she is completely intact and I have low suspicion that this is anything but a migraine of pregnancy.  Low suspicion for temporal arteritis for example low suspicion for  meningitis or bleed etc.   ----------------------------------------- 9:32 AM on 12/31/2018 -----------------------------------------  Patient getting her imaging, she states she feels greatly better after medications here  ----------------------------------------- 10:16 AM on 12/31/2018 -----------------------------------------  Is neurologically intact, patient has been sleeping here.  Certainly no evidence of preeclampsia.  Blood pressure is actually running slightly low which is not unusual for her she states especially when she is asleep here it has been slightly low but it comes back up when she wakes up.  Her headache is greatly improved she remains without any sign or symptom of significant disease  and her MRI is negative.  This is most likely therefore what it looks like which is migraine, we are calling her OBs to let them aware of our findings  ----------------------------------------- 10:24 AM on 12/31/2018 -----------------------------------------  Cussed with Dr. Leafy Ro, very much appreciate consult, she will follow closely as an outpatient we will also refer her to neurology, patient has been looking at her cell phone or sleeping less this time and is no acute distress.  Pt remains at neurologic baseline. At this time, there is nothing to suggest or support the diagnosis of subarachnoid hemorrhage, aneurysmal event, meningitis, tumor or mass, cavernous thrombosis, encephalitis, ischemic stroke, pseudotumor cerebri, glaucoma, temporal arteritis, or any other acute intracrania/neurological process. Extensive return precautions including but not limited to any new or worrisome symptoms such as worsening of, or change in, headache, any neurological symptoms, fever etc.  Natural disease course discussed with patient. The need for follow-up and all of my customary return precautions have been discussed as well.   ____________________________________________   FINAL CLINICAL  IMPRESSION(S) / ED DIAGNOSES  Final diagnoses:  None      This chart was dictated using voice recognition software.  Despite best efforts to proofread,  errors can occur which can change meaning.      Schuyler Amor, MD 12/31/18 [id number]    Schuyler Amor, MD 12/31/18 [id number]    Schuyler Amor, MD 12/31/18 1025    Schuyler Amor, MD 12/31/18 1025

## 2018-12-31 NOTE — ED Notes (Signed)
Pt is being discharged to home. Pt is AOx4, vss, her headache remains at a 2/10 and she does not show any signs of distress. AVS was given and explained to the pt and she verbalized understanding of all information.

## 2019-01-01 NOTE — Progress Notes (Signed)
Pt discharge home to self care. Pt verbalized understanding of her triage and treatment. Pt stated her headache is no longer there. Pt advised to return if headache returns and stays after attempts to rest and drink plenty of fluid. Pt advise to notify MD of vaginal bleeding and LOF. Pt v/s WNL

## 2019-01-01 NOTE — Discharge Summary (Signed)
After assessment and meds, pt headache resolved.  Plan for f/u neurology and OB this week.

## 2019-01-24 DIAGNOSIS — Z8669 Personal history of other diseases of the nervous system and sense organs: Secondary | ICD-10-CM | POA: Diagnosis not present

## 2019-01-24 DIAGNOSIS — R42 Dizziness and giddiness: Secondary | ICD-10-CM | POA: Diagnosis not present

## 2019-02-02 DIAGNOSIS — R8271 Bacteriuria: Secondary | ICD-10-CM | POA: Diagnosis not present

## 2019-02-02 DIAGNOSIS — Z3A20 20 weeks gestation of pregnancy: Secondary | ICD-10-CM | POA: Diagnosis not present

## 2019-02-02 DIAGNOSIS — Z363 Encounter for antenatal screening for malformations: Secondary | ICD-10-CM | POA: Diagnosis not present

## 2019-02-02 DIAGNOSIS — Z3402 Encounter for supervision of normal first pregnancy, second trimester: Secondary | ICD-10-CM | POA: Diagnosis not present

## 2019-03-06 ENCOUNTER — Encounter: Payer: Self-pay | Admitting: Internal Medicine

## 2019-03-06 ENCOUNTER — Other Ambulatory Visit: Payer: Self-pay

## 2019-03-06 ENCOUNTER — Ambulatory Visit (INDEPENDENT_AMBULATORY_CARE_PROVIDER_SITE_OTHER): Payer: BLUE CROSS/BLUE SHIELD | Admitting: Internal Medicine

## 2019-03-06 DIAGNOSIS — J4521 Mild intermittent asthma with (acute) exacerbation: Secondary | ICD-10-CM

## 2019-03-06 MED ORDER — ALBUTEROL SULFATE 1.25 MG/3ML IN NEBU: 1.0000 | INHALATION_SOLUTION | Freq: Four times a day (QID) | RESPIRATORY_TRACT | 1 refills | Status: AC | PRN

## 2019-03-06 MED ORDER — FLUTICASONE PROPIONATE HFA 44 MCG/ACT IN AERO: 1.0000 | INHALATION_SPRAY | Freq: Every day | RESPIRATORY_TRACT | 1 refills | Status: DC

## 2019-03-06 NOTE — Patient Instructions (Signed)
Flovent 1 puff daily - rinse after use. Once use of albuterol is down to twice a week or less, stop Flovent. Follow up if no improving.

## 2019-03-06 NOTE — Progress Notes (Signed)
Date:  03/06/2019   Name:  Beverly Thomas   DOB:  [birth year]   MRN:  [id number]   Chief Complaint: Asthma (OB sent her here as she could not clear her to use Neb. Has been having trouble breathing x 7 days )  Asthma  She complains of chest tightness, cough and wheezing. The problem occurs daily. The cough is non-productive. Associated symptoms include headaches. Pertinent negatives include no chest pain, fever or trouble swallowing. Her symptoms are alleviated by beta-agonist. She reports moderate improvement on treatment. Her past medical history is significant for asthma.  She is [redacted] weeks pregnant.  The pregnancy is going well. She has only been using MDI, was told to come here for permission to use nebulizer.  She was previously on Advair.  Review of Systems  Constitutional: Negative for chills, fatigue and fever.  HENT: Negative for trouble swallowing.   Respiratory: Positive for cough and wheezing.   Cardiovascular: Negative for chest pain, palpitations and leg swelling.  Gastrointestinal: Negative for abdominal pain.  Musculoskeletal: Negative for arthralgias.  Neurological: Positive for headaches. Negative for dizziness.  Hematological: Negative for adenopathy.  Psychiatric/Behavioral: Positive for sleep disturbance. Negative for dysphoric mood. The patient is not nervous/anxious.     Patient Active Problem List   Diagnosis Date Noted  . Labor and delivery, indication for care 01/01/2019  . Supervision of high risk pregnancy in second trimester 11/25/2018  . Obesity (BMI 30.0-34.9) 06/15/2018  . Cyst, sebaceous, breast 06/29/2016  . H/O abnormal cervical Papanicolaou smear 05/27/2015  . Angio-edema 05/27/2015  . Allergic state 05/27/2015  . 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 Asthma, mild intermittent 05/27/2015  . Back muscle spasm 05/27/2015  . Avitaminosis D 05/27/2015    Allergies  Allergen Reactions  . Diazepam     Other  reaction(s): pruritis  . Diflucan [Fluconazole]   . Metronidazole     Other reaction(s): Angioedema  . Sulfa Antibiotics Hives  . Zyrtec  [Cetirizine]     Other reaction(s): Hives  . Latex Rash    Past Surgical History:  Procedure Laterality Date  . BUNIONECTOMY Bilateral 2013, 2014  . COLPOSCOPY  2011    Social History   Tobacco Use  . Smoking status: Never Smoker  . Smokeless tobacco: Never Used  Substance Use Topics  . Alcohol use: Not Currently    Alcohol/week: 0.0 standard drinks    Comment: social  . Drug use: No     Medication list has been reviewed and updated.  Current Meds  Medication Sig  . albuterol (PROVENTIL HFA;VENTOLIN HFA) 108 (90 Base) MCG/ACT inhaler Inhale 2 puffs into the lungs every 6 (six) hours as needed for wheezing or shortness of breath.  Marland Kitchen b complex vitamins tablet Take 1 tablet by mouth daily.  . Calcium Carb-Cholecalciferol (CALCIUM 1000 + D PO) Take by mouth.  . Cholecalciferol (VITAMIN D3) 5000 units CAPS Take by mouth.  . folic acid (FOLVITE) 800 MCG tablet Take 800 mcg by mouth daily.  Marland Kitchen loratadine (CLARITIN) 10 MG tablet Take 10 mg by mouth daily.  . montelukast (SINGULAIR) 10 MG tablet Take 1 tablet (10 mg total) by mouth daily.   Current Facility-Administered Medications for the 03/06/19 encounter (Office Visit) with Reubin Milan, MD  Medication  . ipratropium-albuterol (DUONEB) 0.5-2.5 (3) MG/3ML nebulizer solution 3 mL    PHQ 2/9 Scores 12/15/2017 06/29/2016 06/01/2016 05/07/2016  PHQ - 2 Score 0 2 6 0  PHQ- 9  Score 0 7 22 -    Physical Exam Vitals signs and nursing note reviewed.  Constitutional:      General: She is not in acute distress.    Appearance: Normal appearance. She is well-developed.  HENT:     Head: Normocephalic and atraumatic.  Neck:     Musculoskeletal: Normal range of motion.  Cardiovascular:     Rate and Rhythm: Normal rate and regular rhythm.     Pulses: Normal pulses.  Pulmonary:     Effort:  Pulmonary effort is normal. No respiratory distress.     Breath sounds: No stridor. Wheezing (in upper chest) present. No rhonchi.  Musculoskeletal:     Right lower leg: No edema.     Left lower leg: No edema.  Lymphadenopathy:     Cervical: No cervical adenopathy.  Skin:    General: Skin is warm and dry.     Findings: No rash.  Neurological:     Mental Status: She is alert and oriented to person, place, and time.  Psychiatric:        Behavior: Behavior normal.        Thought Content: Thought content normal.     Wt Readings from Last 3 Encounters:  03/06/19 203 lb (92.1 kg)  12/31/18 204 lb (92.5 kg)  12/31/18 204 lb (92.5 kg)    BP 107/74   Pulse (!) 102   Temp 98.2 F (36.8 C) (Oral)   Resp 16   Ht 5\' 7"  (1.702 m)   Wt 203 lb (92.1 kg)   LMP 05/27/2018   SpO2 98%   BMI 31.79 kg/m   Assessment and Plan: 1. Mild intermittent asthma with acute exacerbation Will add Flovent daily until symptoms controlled then d/c May use nebulizer, esp at night Continue MDI Return if sx persist - fluticasone (FLOVENT HFA) 44 MCG/ACT inhaler; Inhale 1 puff into the lungs daily.  Dispense: 1 Inhaler; Refill: 1 - albuterol (ACCUNEB) 1.25 MG/3ML nebulizer solution; Take 3 mLs (1.25 mg total) by nebulization every 6 (six) hours as needed for wheezing.  Dispense: 360 mL; Refill: 1   Partially dictated using Animal nutritionist. Any errors are unintentional.  Bari Edward, MD [facility]  03/06/2019

## 2019-03-20 ENCOUNTER — Telehealth: Payer: Self-pay

## 2019-03-20 NOTE — Telephone Encounter (Signed)
Patient called leaving VM asking if she can get advise on wether she should go to work on not. She said she has severe asthma.

## 2019-03-20 NOTE — Telephone Encounter (Signed)
Patient called leaving VM asking if she can get advise on whether she should go to work or not. She said she has severe asthma.  Informed her I spoke with Dr Judithann Graves and she still says she can continue to go to work unless she has traveled internationally or has cough, high fever, and SOB.  She verbalized understanding.

## 2019-06-05 ENCOUNTER — Other Ambulatory Visit: Payer: Self-pay | Admitting: Obstetrics and Gynecology

## 2019-06-05 ENCOUNTER — Telehealth (HOSPITAL_COMMUNITY): Payer: Self-pay | Admitting: *Deleted

## 2019-06-05 ENCOUNTER — Encounter (HOSPITAL_COMMUNITY): Payer: Self-pay | Admitting: *Deleted

## 2019-06-05 NOTE — Telephone Encounter (Signed)
Preadmission screen  

## 2019-06-09 ENCOUNTER — Telehealth: Payer: Self-pay | Admitting: Internal Medicine

## 2019-06-09 NOTE — Telephone Encounter (Signed)
Pt called Beverly Thomas line and stated she wanted her covid test scheduled for Monday 6/22 at 8:20 cancelled. When I tried to ask why she wanted cancelled, she advised it was for an induction, but she does NOT want the test. She said, just cancel the test , I do not want. Can't you just do that? Hung up.  I have not cancelled, will route to dr office and nurse that scheduled.

## 2019-06-12 ENCOUNTER — Other Ambulatory Visit (HOSPITAL_COMMUNITY): Payer: Medicaid Other

## 2019-06-12 ENCOUNTER — Other Ambulatory Visit (HOSPITAL_COMMUNITY)
Admission: RE | Admit: 2019-06-12 | Discharge: 2019-06-12 | Disposition: A | Discharge status: Home / Self Care | Payer: BLUE CROSS/BLUE SHIELD | Source: Ambulatory Visit | Attending: Obstetrics & Gynecology | Admitting: Obstetrics & Gynecology

## 2019-06-14 ENCOUNTER — Inpatient Hospital Stay (HOSPITAL_COMMUNITY): Payer: BLUE CROSS/BLUE SHIELD

## 2019-06-16 ENCOUNTER — Other Ambulatory Visit (HOSPITAL_COMMUNITY): Payer: Medicaid Other

## 2019-06-17 ENCOUNTER — Inpatient Hospital Stay (HOSPITAL_COMMUNITY)
Admission: AD | Admit: 2019-06-17 | Discharge: 2019-06-19 | DRG: 807 | Disposition: A | Discharge status: Home / Self Care | Payer: BLUE CROSS/BLUE SHIELD | Attending: Obstetrics & Gynecology | Admitting: Obstetrics & Gynecology

## 2019-06-17 ENCOUNTER — Other Ambulatory Visit: Payer: Self-pay

## 2019-06-17 ENCOUNTER — Encounter (HOSPITAL_COMMUNITY): Payer: Self-pay

## 2019-06-17 DIAGNOSIS — O9081 Anemia of the puerperium: Secondary | ICD-10-CM | POA: Diagnosis not present

## 2019-06-17 DIAGNOSIS — O26893 Other specified pregnancy related conditions, third trimester: Secondary | ICD-10-CM | POA: Diagnosis present

## 2019-06-17 DIAGNOSIS — J4520 Mild intermittent asthma, uncomplicated: Secondary | ICD-10-CM | POA: Diagnosis present

## 2019-06-17 DIAGNOSIS — F324 Major depressive disorder, single episode, in partial remission: Secondary | ICD-10-CM

## 2019-06-17 DIAGNOSIS — O99824 Streptococcus B carrier state complicating childbirth: Secondary | ICD-10-CM | POA: Diagnosis present

## 2019-06-17 DIAGNOSIS — O99344 Other mental disorders complicating childbirth: Secondary | ICD-10-CM | POA: Diagnosis present

## 2019-06-17 DIAGNOSIS — Z3A39 39 weeks gestation of pregnancy: Secondary | ICD-10-CM | POA: Diagnosis not present

## 2019-06-17 DIAGNOSIS — O9952 Diseases of the respiratory system complicating childbirth: Secondary | ICD-10-CM | POA: Diagnosis present

## 2019-06-17 DIAGNOSIS — Z1159 Encounter for screening for other viral diseases: Secondary | ICD-10-CM

## 2019-06-17 LAB — CBC
HCT: 31.5 % — ABNORMAL LOW (ref 36.0–46.0)
Hemoglobin: 9.6 g/dL — ABNORMAL LOW (ref 12.0–15.0)
MCH: 24.4 pg — ABNORMAL LOW (ref 26.0–34.0)
MCHC: 30.5 g/dL (ref 30.0–36.0)
MCV: 80.2 fL (ref 80.0–100.0)
Platelets: 320 10*3/uL (ref 150–400)
RBC: 3.93 MIL/uL (ref 3.87–5.11)
RDW: 16.1 % — ABNORMAL HIGH (ref 11.5–15.5)
WBC: 10.4 10*3/uL (ref 4.0–10.5)
nRBC: 0 % (ref 0.0–0.2)

## 2019-06-17 LAB — SARS CORONAVIRUS 2 BY RT PCR (HOSPITAL ORDER, PERFORMED IN ~~LOC~~ HOSPITAL LAB): SARS Coronavirus 2: NEGATIVE

## 2019-06-17 LAB — TYPE AND SCREEN
ABO/RH(D): O POS
Antibody Screen: NEGATIVE

## 2019-06-17 LAB — POCT FERN TEST: POCT Fern Test: NEGATIVE

## 2019-06-17 MED ORDER — LIDOCAINE HCL (PF) 1 % IJ SOLN
30.0000 mL | INTRAMUSCULAR | Status: AC | PRN
  Administered 2019-06-18: 30 mL via SUBCUTANEOUS
  Filled 2019-06-17: qty 30

## 2019-06-17 MED ORDER — FENTANYL CITRATE (PF) 100 MCG/2ML IJ SOLN
50.0000 ug | INTRAMUSCULAR | Status: DC | PRN
  Administered 2019-06-17 (×2): 100 ug via INTRAVENOUS
  Filled 2019-06-17 (×2): qty 2

## 2019-06-17 MED ORDER — SOD CITRATE-CITRIC ACID 500-334 MG/5ML PO SOLN: 30.0000 mL | ORAL | Status: DC | PRN

## 2019-06-17 MED ORDER — ONDANSETRON HCL 4 MG/2ML IJ SOLN
4.0000 mg | Freq: Four times a day (QID) | INTRAMUSCULAR | Status: DC | PRN
  Administered 2019-06-18: 4 mg via INTRAVENOUS
  Filled 2019-06-17: qty 2

## 2019-06-17 MED ORDER — OXYTOCIN 40 UNITS IN NORMAL SALINE INFUSION - SIMPLE MED
2.5000 [IU]/h | INTRAVENOUS | Status: DC
  Administered 2019-06-18: 2.5 [IU]/h via INTRAVENOUS
  Filled 2019-06-17: qty 1000

## 2019-06-17 MED ORDER — LACTATED RINGERS IV SOLN
500.0000 mL | INTRAVENOUS | Status: DC | PRN
  Administered 2019-06-17: 1000 mL via INTRAVENOUS

## 2019-06-17 MED ORDER — ACETAMINOPHEN 325 MG PO TABS: 650.0000 mg | ORAL_TABLET | ORAL | Status: DC | PRN

## 2019-06-17 MED ORDER — SODIUM CHLORIDE 0.9% FLUSH: 3.0000 mL | INTRAVENOUS | Status: DC | PRN

## 2019-06-17 MED ORDER — SODIUM CHLORIDE 0.9 % IV SOLN
2.0000 g | INTRAVENOUS | Status: DC
  Administered 2019-06-17 – 2019-06-18 (×2): 2 g via INTRAVENOUS
  Filled 2019-06-17 (×2): qty 2000

## 2019-06-17 MED ORDER — OXYCODONE-ACETAMINOPHEN 5-325 MG PO TABS: 1.0000 | ORAL_TABLET | ORAL | Status: DC | PRN

## 2019-06-17 MED ORDER — SODIUM CHLORIDE 0.9 % IV SOLN: 250.0000 mL | INTRAVENOUS | Status: DC | PRN

## 2019-06-17 MED ORDER — OXYCODONE-ACETAMINOPHEN 5-325 MG PO TABS: 2.0000 | ORAL_TABLET | ORAL | Status: DC | PRN

## 2019-06-17 MED ORDER — OXYTOCIN BOLUS FROM INFUSION
500.0000 mL | Freq: Once | INTRAVENOUS | Status: AC
  Administered 2019-06-18: 500 mL via INTRAVENOUS

## 2019-06-17 NOTE — Progress Notes (Signed)
10 instruments 5 9*9 5 4*18 2 Injectables

## 2019-06-17 NOTE — Plan of Care (Signed)

## 2019-06-17 NOTE — MAU Note (Signed)
Pt deferred COVID swab L&D charge nurse notified and stated to let the patient know that she will have to be treated as a +Pt and that according to the pediatrician discretion they will potentially test the baby. Pt then states she would rather take the test. Pt swabbed and sent to L&D.

## 2019-06-17 NOTE — H&P (Signed)
Beverly Thomas is a 38 y.o. female presenting for active labor. OB History    Gravida  3   Para  1   Term  1   Preterm      AB  1   Living  1     SAB      TAB  1   Ectopic      Multiple      Live Births  1          Past Medical History:  Diagnosis Date  . Allergy   . Asthma   . Depression   . Headache   . Vaginal Pap smear, abnormal    Past Surgical History:  Procedure Laterality Date  . BUNIONECTOMY Bilateral 2013, 2014  . COLPOSCOPY  2011   Family History: family history includes Diabetes in her mother; Hypertension in her mother. Social History:  reports that she has never smoked. She has never used smokeless tobacco. She reports previous alcohol use. She reports that she does not use drugs.     Maternal Diabetes: No Genetic Screening: Normal Maternal Ultrasounds/Referrals: Normal Fetal Ultrasounds or other Referrals:  None Maternal Substance Abuse:  No Significant Maternal Medications:  Meds include: Other: Celexa for depression Significant Maternal Lab Results:  Group B Strep positive Other Comments:  None  Review of Systems  Gastrointestinal: Negative for abdominal pain.  All other systems reviewed and are negative.  Maternal Medical History:  Reason for admission: Contractions.   Contractions: Frequency: regular.   Perceived severity is strong.    Fetal activity: Perceived fetal activity is normal.   Last perceived fetal movement was within the past hour.    Prenatal Complications - Diabetes: none.      Dilation: 7 Effacement (%): 90 Station: -2 Exam by:: Benji StanleyRN   Vitals:   06/17/19 2007 06/17/19 2009 06/17/19 2014 06/17/19 2019  BP: 128/75     Pulse: 91     Resp: 16     Temp:      TempSrc:      SpO2:  97% 97% 98%  Weight:        Maternal Exam:  Abdomen: Patient reports no abdominal tenderness. Fundal height is Size=dates.   Estimated fetal weight is 7.5lbs .   Fetal presentation: vertex  Introitus: Normal  vulva. Normal vagina.  Amniotic fluid character: not assessed.  Pelvis: adequate for delivery.   Cervix: Cervix evaluated by digital exam.     Fetal Exam Fetal Monitor Review: Mode: ultrasound.   Baseline rate: 140a.  Variability: moderate (6-25 bpm).   Pattern: accelerations present and no decelerations.    Fetal State Assessment: Category I - tracings are normal.     Physical Exam  Nursing note and vitals reviewed. Constitutional: She is oriented to person, place, and time. She appears well-developed and well-nourished.  HENT:  Head: Normocephalic and atraumatic.  Eyes: Pupils are equal, round, and reactive to light.  Cardiovascular: Normal rate, regular rhythm and normal heart sounds.  Respiratory: Effort normal and breath sounds normal. No respiratory distress.  GI: There is no abdominal tenderness.  Genitourinary:    Vulva, vagina and uterus normal.   Musculoskeletal: Normal range of motion.  Neurological: She is alert and oriented to person, place, and time.  Skin: Skin is warm and dry.  Psychiatric: She has a normal mood and affect. Her behavior is normal. Judgment and thought content normal.     Prenatal labs: ABO, Rh: --/--/PENDING (06/27 2045) Antibody: PENDING (06/27 2045)  Rubella: Immune (12/06 0000) RPR: Nonreactive (12/06 0000)  HBsAg: Negative (12/06 0000)  HIV: Non-reactive (12/06 0000)  GBS: Positive (12/06 0000)   Assessment/Plan: 39 y.o. G3P1 at [redacted]w[redacted]d Active labor History of asthma and depression on Celexa 21 years since previous birth 90 to L&D Ampicillin for GBS prophylaxis as patient in advanced labor  IV medications for pain management, patient doesn't desire epidural  Anticipate NSVD    Marikay Alar 06/17/2019, 9:15 PM

## 2019-06-17 NOTE — MAU Note (Signed)
Pt reports to MAU c/o ctxs that are 2-3 min apart. Pt states she lost her mucous plug. Pt reports she thinks her water is leaking around Wednesday morning clear fluid. +FM.

## 2019-06-18 ENCOUNTER — Encounter (HOSPITAL_COMMUNITY): Payer: Self-pay

## 2019-06-18 LAB — CBC
HCT: 30.6 % — ABNORMAL LOW (ref 36.0–46.0)
Hemoglobin: 9.5 g/dL — ABNORMAL LOW (ref 12.0–15.0)
MCH: 24.6 pg — ABNORMAL LOW (ref 26.0–34.0)
MCHC: 31 g/dL (ref 30.0–36.0)
MCV: 79.3 fL — ABNORMAL LOW (ref 80.0–100.0)
Platelets: 286 10*3/uL (ref 150–400)
RBC: 3.86 MIL/uL — ABNORMAL LOW (ref 3.87–5.11)
RDW: 16.2 % — ABNORMAL HIGH (ref 11.5–15.5)
WBC: 16.4 10*3/uL — ABNORMAL HIGH (ref 4.0–10.5)
nRBC: 0 % (ref 0.0–0.2)

## 2019-06-18 LAB — RPR: RPR Ser Ql: NONREACTIVE

## 2019-06-18 LAB — BIRTH TISSUE RECOVERY COLLECTION (PLACENTA DONATION)

## 2019-06-18 LAB — ABO/RH: ABO/RH(D): O POS

## 2019-06-18 MED ORDER — SIMETHICONE 80 MG PO CHEW: 80.0000 mg | CHEWABLE_TABLET | ORAL | Status: DC | PRN

## 2019-06-18 MED ORDER — WITCH HAZEL-GLYCERIN EX PADS: 1.0000 "application " | MEDICATED_PAD | CUTANEOUS | Status: DC | PRN

## 2019-06-18 MED ORDER — ONDANSETRON HCL 4 MG/2ML IJ SOLN: 4.0000 mg | INTRAMUSCULAR | Status: DC | PRN

## 2019-06-18 MED ORDER — IBUPROFEN 600 MG PO TABS
600.0000 mg | ORAL_TABLET | Freq: Four times a day (QID) | ORAL | Status: DC
  Administered 2019-06-18 – 2019-06-19 (×6): 600 mg via ORAL
  Filled 2019-06-18 (×6): qty 1

## 2019-06-18 MED ORDER — CITALOPRAM HYDROBROMIDE 20 MG PO TABS
20.0000 mg | ORAL_TABLET | Freq: Every day | ORAL | Status: DC
  Administered 2019-06-19: 20 mg via ORAL
  Filled 2019-06-18: qty 1

## 2019-06-18 MED ORDER — ACETAMINOPHEN 325 MG PO TABS: 650.0000 mg | ORAL_TABLET | ORAL | Status: DC | PRN

## 2019-06-18 MED ORDER — BENZOCAINE-MENTHOL 20-0.5 % EX AERO: 1.0000 "application " | INHALATION_SPRAY | CUTANEOUS | Status: DC | PRN

## 2019-06-18 MED ORDER — PRENATAL MULTIVITAMIN CH
1.0000 | ORAL_TABLET | Freq: Every day | ORAL | Status: DC
  Administered 2019-06-18 – 2019-06-19 (×2): 1 via ORAL
  Filled 2019-06-18 (×2): qty 1

## 2019-06-18 MED ORDER — ONDANSETRON HCL 4 MG PO TABS: 4.0000 mg | ORAL_TABLET | ORAL | Status: DC | PRN

## 2019-06-18 MED ORDER — TETANUS-DIPHTH-ACELL PERTUSSIS 5-2.5-18.5 LF-MCG/0.5 IM SUSP: 0.5000 mL | Freq: Once | INTRAMUSCULAR | Status: DC

## 2019-06-18 MED ORDER — DIBUCAINE (PERIANAL) 1 % EX OINT: 1.0000 "application " | TOPICAL_OINTMENT | CUTANEOUS | Status: DC | PRN

## 2019-06-18 MED ORDER — DIPHENHYDRAMINE HCL 25 MG PO CAPS: 25.0000 mg | ORAL_CAPSULE | Freq: Four times a day (QID) | ORAL | Status: DC | PRN

## 2019-06-18 MED ORDER — SENNOSIDES-DOCUSATE SODIUM 8.6-50 MG PO TABS
2.0000 | ORAL_TABLET | ORAL | Status: DC
  Administered 2019-06-18: 2 via ORAL
  Filled 2019-06-18: qty 2

## 2019-06-18 MED ORDER — FERROUS SULFATE 325 (65 FE) MG PO TABS
325.0000 mg | ORAL_TABLET | Freq: Two times a day (BID) | ORAL | Status: DC
  Administered 2019-06-18 – 2019-06-19 (×2): 325 mg via ORAL
  Filled 2019-06-18 (×2): qty 1

## 2019-06-18 MED ORDER — ZOLPIDEM TARTRATE 5 MG PO TABS: 5.0000 mg | ORAL_TABLET | Freq: Every evening | ORAL | Status: DC | PRN

## 2019-06-18 MED ORDER — COCONUT OIL OIL: 1.0000 "application " | TOPICAL_OIL | Status: DC | PRN

## 2019-06-18 NOTE — Progress Notes (Signed)
Beverly Thomas is a 38 y.o. G3P1011 at [redacted]w[redacted]d by admitted for active labor  Subjective: Patient with good control, requesting IV pain medication. Discussed safety concerns with newborn and narcotics at this late stage of labor. Discussed AROM as patient desires delivery as soon as possible, discussed risks and benefits and patient consented. AROM clear.   Objective: BP 126/67   Pulse 88   Temp 99.5 F (37.5 C) (Oral)   Resp 18   Ht 5\' 7"  (1.702 m)   Wt 102.9 kg   LMP 05/27/2018   SpO2 98%   BMI 35.53 kg/m  No intake/output data recorded. No intake/output data recorded.  FHT:  FHR: 140 bpm, variability: moderate,  accelerations:  Present,  decelerations:  Absent UC:   irregular, every 2-4 minutes SVE:   Dilation: 9 Effacement (%): 100 Station: -1 Exam by:: E. Greer,CNM  Labs: Lab Results  Component Value Date   WBC 10.4 06/17/2019   HGB 9.6 (L) 06/17/2019   HCT 31.5 (L) 06/17/2019   MCV 80.2 06/17/2019   PLT 320 06/17/2019    Assessment / Plan: Spontaneous labor, progressing normally  Labor: Progressing normally Preeclampsia:  no signs or symptoms of toxicity, intake and ouput balanced and labs stable Fetal Wellbeing:  Category I Pain Control:  Labor support without medications and IV pain meds I/D:  GBS positive, Ampicillin due to advanced dilation Anticipated MOD:  NSVD  Marikay Alar 06/18/2019, 1:20 AM

## 2019-06-18 NOTE — Lactation Note (Signed)
This note was copied from a baby's chart. Lactation Consultation Note  Patient Name: Beverly Thomas [id number] Date: 06/18/2019 Reason for consult: Initial assessment;Term  Randel Books is 101 hours old , term,  Per mom the baby last fed 11 am for 20 mins.  Per mom its been 17 years since by 1st baby and  I breast fed 8 months without problems.  Mom reports breast changes with pregnancy.  Baby is asleep in crib.  LC offered to explain and review hand expressing and  Mom was able to independently hand express 2-3 drops  After breast massage.  LC recommended to mom to call when the baby was showing feeding cues For [facility] for feeding assessment. Also mentioned RN's can do feeding assessments and latch scores.  LC was called back about 20 mins after the end of the consult by the [facility] and baby was latched.  Mom preferred to use the cradle position per RN and baby already latched wrapped in a blanket.  Per mom comfortable.  LC reviewed basics since it has been 17 years and mentioned to mom for the next feeding place the baby  STS and allow the baby to hug her breast and when she is latching use breast compressions to insure the  Baby obtains the depth and she will hear more swallows.  LC showed mom how to use breast compressions above the baby's mouth behind majority of the milk ducts and On the side of the breast. Increased swallows noted and mom mentioned she felt a stronger comfort able tug latched.  Baby still feeding and MBURN aware to complete the total time.   Per mom has DEBP [place] at home.  LC provided the information for [facility] resources post D/C  For [facility].     Maternal Data Has patient been taught Hand Expression?: Yes(mom able to hand express on her own with drops) Does the patient have breastfeeding experience prior to this delivery?: Yes  Feeding Feeding Type: (per mom last  fed AT 11- AM 20 MINS)  LATCH Score                   Interventions Interventions: Breast  feeding basics reviewed  Lactation Tools Discussed/Used WIC Program: No Pump Review: Milk Storage   Consult Status Consult Status: Follow-up Date: 06/18/19 Follow-up type: In-patient    [place] 06/18/2019, 12:14 PM

## 2019-06-18 NOTE — Progress Notes (Signed)
MOB was referred for history of depression. * Referral screened out by Clinical Social Worker because none of the following criteria appear to apply: ~ History of anxiety/depression during this pregnancy, or of post-partum depression following prior delivery. ~ Diagnosis of anxiety and/or depression within last 3 years OR * MOB's symptoms currently being treated with medication and/or therapy. Per chart review, MOB is taking/has an active prescription for Celexa.  Please contact the Clinical Social Worker if needs arise, by MOB request, or if MOB scores greater than 9/yes to question 10 on Edinburgh Postpartum Depression Screen.  Jorja Empie, LCSW Clinical Social Worker [facility] Cell#: [phone]    

## 2019-06-19 DIAGNOSIS — O9081 Anemia of the puerperium: Secondary | ICD-10-CM | POA: Diagnosis present

## 2019-06-19 MED ORDER — IBUPROFEN 600 MG PO TABS: 600.0000 mg | ORAL_TABLET | Freq: Four times a day (QID) | ORAL | 0 refills | Status: DC

## 2019-06-19 NOTE — Progress Notes (Addendum)
CSW received consult for Edinburgh Score of 14.  CSW met with MOB to offer support and complete assessment.    MOB sitting on couch holding infant, when CSW entered the room. FOB also present at bedside. CSW introduced self and received verbal permission from MOB to complete assessment with FOB present. CSW explained reason for consult due to [facility] score, to which MOB reported her score was "probably biased". MOB shared that she had hoped to have delivered earlier last week and that all of the other moms she knew that were supposed to deliver around the same time, had given birth and she was just tired of being pregnant. MOB confirmed having a history of depression, diagnosed back in 2016 and stated she received therapy at that time but has not since. MOB reported that she is currently taking Celexa and started taking it around January or February when she noted symptoms arising. CSW inquired about if MOB had experienced PPD with her 38 year old daughter and MOB responded that "if she did, she didn't know what it was." CSW provided education regarding the baby blues period vs. perinatal mood disorders, discussed treatment and gave resources for mental health follow up if concerns arise.  CSW recommends self-evaluation during the postpartum time period using the New Mom Checklist from Postpartum Progress and encouraged MOB to contact a medical professional if symptoms are noted at any time. MOB denied any current SI or HI and reported having good support from FOB, her daughter and her father.     CSW identifies no further need for intervention and no barriers to discharge at this time.  Elijio Miles, [place]  [facility] and [employer] [phone]

## 2019-06-19 NOTE — Progress Notes (Signed)
Abdominal binder given.

## 2019-06-19 NOTE — Discharge Summary (Signed)
SVD OB Discharge Summary     Patient Name: Beverly Thomas DOB: 1981-11-06 MRN: [id number]  Date of admission: 06/17/2019 Delivering MD: Ranee Gosselin K  Date of delivery: 06/18/2019 Type of delivery: SVD  Newborn Data: Sex: Baby  female  Circumcision: out pt circ desired Live born female  Birth Weight: 8 lb 4.1 oz (3745 g) APGAR: 8, 9  Newborn Delivery   Birth date/time: 06/18/2019 03:23:00 Delivery type: Vaginal, Spontaneous      Feeding: breast Infant being discharge to home with mother in stable condition.   Admitting diagnosis: pregnancy Intrauterine pregnancy: [redacted]w[redacted]d     Secondary diagnosis:  Active Problems:   Depression, major, single episode, in partial remission (HCC)   Asthma, mild intermittent   Normal labor   Normal postpartum course   Postpartum anemia                                Complications: None                                                              Intrapartum Procedures: spontaneous vaginal delivery and GBS prophylaxis Postpartum Procedures: none Complications-Operative and Postpartum: 1st degree perineal laceration Augmentation: None   History of Present Illness: [name] is a 38 y.o. female, [id number], who presents at [redacted]w[redacted]d weeks gestation. The patient has been followed at  [facility] and Gynecology  Her pregnancy has been complicated by:  Patient Active Problem List   Diagnosis Date Noted  . Normal postpartum course 06/19/2019  . Postpartum anemia 06/19/2019  . Normal labor 06/17/2019  . Labor and delivery, indication for care 01/01/2019  . Supervision of high risk pregnancy in second trimester 11/25/2018  . Obesity (BMI 30.0-34.9) 06/15/2018  . Cyst, sebaceous, breast 06/29/2016  . H/O abnormal cervical Papanicolaou smear 05/27/2015  . Angio-edema 05/27/2015  . Allergic state 05/27/2015  . Depression, major, single episode, in partial remission ([place]) 05/27/2015  . Asthma, mild intermittent 05/27/2015  .  Back muscle spasm 05/27/2015  . Avitaminosis D 05/27/2015    Hospital course:  Onset of Labor With Vaginal Delivery     38 y.o. yo [id number] at [redacted]w[redacted]d was admitted in Active Labor on 06/17/2019. Patient had an uncomplicated labor course as follows:  Membrane Rupture Time/Date: 1:10 AM ,06/18/2019   Intrapartum Procedures: Episiotomy: None [1]                                         Lacerations:  1st degree [2];Perineal [11]  Patient had a delivery of a Viable infant. 06/18/2019  Information for the patient's newborn:  Lyra, Alaimo [[id number]]  Delivery Method: Vaginal, Spontaneous(Filed from Delivery Summary)     Pateint had an uncomplicated postpartum course.  She is ambulating, tolerating a regular diet, passing flatus, and urinating well. Patient is discharged home in stable condition on 06/19/19.  Postpartum Day # 1 : S/P NSVD due to spontaneous active labor. Patient up ad lib, denies syncope or dizziness. Reports consuming regular diet without issues and denies N/V. Patient reports 0 bowel movement + passing flatus.  Denies issues with  urination and reports bleeding is "lighter."  Patient is breast and bottlefeeding and reports going well.  Desires IUD for postpartum contraception.  Pain is being appropriately managed with use of po meds. Pt has h/o depression on celexa 20mg  daily, denies si/hi, endorses feeling mood is stable. Pt also has h/o asthma controlled on no meds now. Pt started hgb was 9.6 and dropped to 9.5, pt endorses fatigue but no other s/sx of anemia.   1st laceration Feeding:  Breast and bottle Contraceptive plan:  IUD BB: Circ out pt circ desired   Physical exam  Vitals:   06/18/19 0957 06/18/19 1400 06/18/19 2325 06/19/19 0559  BP: 130/61 117/67 109/64 (!) 108/58  Pulse: 70 77 78 71  Resp: 20 20 16 18   Temp: 98.1 F (36.7 C) 98.8 F (37.1 C) 98.8 F (37.1 C) 99 F (37.2 C)  TempSrc:   Oral Oral  SpO2:   100% 100%  Weight:      Height:       General:  alert, cooperative and no distress Lochia: appropriate Uterine Fundus: firm Perineum: Approximate, no hematomas noted.  DVT Evaluation: No evidence of DVT seen on physical exam. Negative Homan's sign. No cords or calf tenderness. No significant calf/ankle edema.  Labs: Lab Results  Component Value Date   WBC 16.4 (H) 06/18/2019   HGB 9.5 (L) 06/18/2019   HCT 30.6 (L) 06/18/2019   MCV 79.3 (L) 06/18/2019   PLT 286 06/18/2019   CMP Latest Ref Rng & Units 12/31/2018  Glucose 70 - 99 mg/dL 84  BUN 6 - 20 mg/dL <1(H<5(L)  Creatinine 0.860.44 - 1.00 mg/dL 5.780.51  Sodium 469135 - 629145 mmol/L 136  Potassium 3.5 - 5.1 mmol/L 3.6  Chloride 98 - 111 mmol/L 106  CO2 22 - 32 mmol/L 23  Calcium 8.9 - 10.3 mg/dL 5.2(W8.5(L)  Total Protein 6.5 - 8.1 g/dL 6.4(L)  Total Bilirubin 0.3 - 1.2 mg/dL 0.4  Alkaline Phos 38 - 126 U/L 37(L)  AST 15 - 41 U/L 19  ALT 0 - 44 U/L 16    Date of discharge: 06/19/2019 Discharge Diagnoses: Term Pregnancy-delivered Discharge instruction: per After Visit Summary and "Baby and Me Booklet".  After visit meds:  Activity:           unrestricted and pelvic rest Advance as tolerated. Pelvic rest for 6 weeks.  Diet:                routine Medications: PNV, Ibuprofen, Colace and Iron Postpartum contraception: IUD Mirena Condition:  Pt discharge to home with baby in stable Anemia: Iron Depression: report si/hi, continue celexa 20mg  daily and 2 week mental health check with ccob.   Meds: Allergies as of 06/19/2019      Reactions   Diazepam Other (See Comments)   Other reaction(s): pruritis   Diflucan [fluconazole] Swelling   Lips swell   Metronidazole Other (See Comments)   Other reaction(s): Angioedema   Milk-related Compounds Nausea And Vomiting   Sulfa Antibiotics Hives   Zyrtec [cetirizine] Hives   Latex Rash      Medication List    STOP taking these medications   diphenhydrAMINE 25 MG tablet Commonly known as: BENADRYL     TAKE these medications   albuterol  108 (90 Base) MCG/ACT inhaler Commonly known as: VENTOLIN HFA Inhale 2 puffs into the lungs every 6 (six) hours as needed for wheezing or shortness of breath.   albuterol 1.25 MG/3ML nebulizer solution Commonly known as: ACCUNEB Take 3  mLs (1.25 mg total) by nebulization every 6 (six) hours as needed for wheezing.   b complex vitamins tablet Take 1 tablet by mouth daily.   citalopram 20 MG tablet Commonly known as: CELEXA Take 20 mg by mouth at bedtime.   fluticasone 44 MCG/ACT inhaler Commonly known as: FLOVENT HFA Inhale 1 puff into the lungs daily.   Fluticasone-Salmeterol 500-50 MCG/DOSE Aepb Commonly known as: Advair Diskus Inhale 1 puff into the lungs 2 (two) times daily.   folic acid 800 MCG tablet Commonly known as: FOLVITE Take 800 mcg by mouth daily.   ibuprofen 600 MG tablet Commonly known as: ADVIL Take 1 tablet (600 mg total) by mouth every 6 (six) hours.   loratadine 10 MG tablet Commonly known as: CLARITIN Take 10 mg by mouth daily.   MAGnesium-Oxide 400 (241.3 Mg) MG tablet Generic drug: magnesium oxide Take 400 mg by mouth at bedtime.   metoCLOPramide 10 MG tablet Commonly known as: REGLAN Take 10 mg by mouth every 6 (six) hours as needed (For headache.).   montelukast 10 MG tablet Commonly known as: SINGULAIR Take 10 mg by mouth at bedtime.   pantoprazole 20 MG tablet Commonly known as: PROTONIX Take 40 mg by mouth daily.   prenatal multivitamin Tabs tablet Take 1 tablet by mouth daily at 12 noon.   Vitamin D3 125 MCG (5000 UT) Caps Take 1 capsule by mouth daily.       Discharge Follow Up:  Follow-up Information    [facility] Follow up in 6 week(s).   Specialty: Obstetrics and Gynecology Why: a 2 week mental health check and 6 week PPV Contact information: [address] [place] [phone]           [place]villeJade Edona Schreffler, NP-C, CNM 06/19/2019, 7:14 AM  Dale DurhamJade  Chrisie Jankovich, FNP

## 2019-06-19 NOTE — Lactation Note (Signed)
This note was copied from a baby's chart. Lactation Consultation Note  Patient Name: Beverly Thomas [id number] Date: 06/19/2019    Infant was not actively suckling at breast while I was in room. Sound of swallows reviewed & I asked Mom to do breast compressions while infant is at breast to keep him engaged.   Mom & Dad inquired about food compatibility with breastfeeding. Question answered. Mom is interested in beginning pumping. I wrote down [phone] for Mom in case she has any lactation questions after discharge.  Mom is on citalopram 20 mg (L2).  I addressed the importance of good sleep for Mom to protect her against PPMD (which may mean infant being supplemented with a bottle at times). Parents verbalized understanding. Mom's score on her Flavia Shipper noted to be 14.   Beverly Thomas [facility] 06/19/2019, 11:05 AM

## 2019-07-31 DIAGNOSIS — F329 Major depressive disorder, single episode, unspecified: Secondary | ICD-10-CM | POA: Diagnosis not present

## 2019-08-29 DIAGNOSIS — Z30430 Encounter for insertion of intrauterine contraceptive device: Secondary | ICD-10-CM | POA: Diagnosis not present

## 2019-08-29 DIAGNOSIS — Z113 Encounter for screening for infections with a predominantly sexual mode of transmission: Secondary | ICD-10-CM | POA: Diagnosis not present

## 2020-02-14 ENCOUNTER — Other Ambulatory Visit: Payer: Self-pay

## 2020-02-14 ENCOUNTER — Ambulatory Visit (INDEPENDENT_AMBULATORY_CARE_PROVIDER_SITE_OTHER): Payer: Medicaid Other | Admitting: Internal Medicine

## 2020-02-14 ENCOUNTER — Encounter: Payer: Self-pay | Admitting: Internal Medicine

## 2020-02-14 VITALS — BP 124/78 | HR 102 | Ht 67.0 in | Wt 231.0 lb

## 2020-02-14 DIAGNOSIS — F324 Major depressive disorder, single episode, in partial remission: Secondary | ICD-10-CM

## 2020-02-14 DIAGNOSIS — L659 Nonscarring hair loss, unspecified: Secondary | ICD-10-CM

## 2020-02-14 NOTE — Progress Notes (Signed)
Date:  02/14/2020   Name:  Beverly Thomas   DOB:  [birth year]   MRN:  [id number]   Chief Complaint: Depression (PHQ9- 17) and Alopecia (Losing hair at a fast rate in the middle of the back of her head. Needs dermatologist- would like see Dr Phillip Heal. )  Depression        This is a chronic problem.  The problem occurs daily.  The problem has been gradually worsening since onset.  Associated symptoms include fatigue, hopelessness, insomnia and decreased interest.  Associated symptoms include no decreased concentration, not irritable, no headaches and no suicidal ideas.  Past treatments include SSRIs - Selective serotonin reuptake inhibitors (celexa increased to 40 mg at post partum visit - helped some but now plateaued benefit).  Compliance with treatment is good. Hair loss - started about 2 months ago.  Has very thin area on top of head that itches.  She is concerned and would like to see a dermatologist.  Lab Results  Component Value Date   CREATININE 0.51 12/31/2018   BUN <5 (L) 12/31/2018   NA 136 12/31/2018   K 3.6 12/31/2018   CL 106 12/31/2018   CO2 23 12/31/2018   Lab Results  Component Value Date   CHOL 187 06/29/2016   HDL 66 06/29/2016   LDLCALC 104 (H) 06/29/2016   TRIG 84 06/29/2016   CHOLHDL 2.8 06/29/2016   Lab Results  Component Value Date   TSH 3.550 06/29/2016   No results found for: HGBA1C   Review of Systems  Constitutional: Positive for fatigue. Negative for unexpected weight change.  Respiratory: Negative for chest tightness and shortness of breath.   Cardiovascular: Negative for chest pain and leg swelling.  Skin: Negative for rash.       Hair loss  Neurological: Negative for dizziness and headaches.  Psychiatric/Behavioral: Positive for depression, dysphoric mood and sleep disturbance. Negative for decreased concentration, hallucinations and suicidal ideas. The patient has insomnia. The patient is not nervous/anxious.     Patient Active Problem List     Diagnosis Date Noted  . Normal postpartum course 06/19/2019  . Postpartum anemia 06/19/2019  . Normal labor 06/17/2019  . Labor and delivery, indication for care 01/01/2019  . Supervision of high risk pregnancy in second trimester 11/25/2018  . Obesity (BMI 30.0-34.9) 06/15/2018  . Cyst, sebaceous, breast 06/29/2016  . H/O abnormal cervical Papanicolaou smear 05/27/2015  . Angio-edema 05/27/2015  . Allergic state 05/27/2015  . Depression, major, single episode, in partial remission ([place]) 05/27/2015  . Asthma, mild intermittent 05/27/2015  . Back muscle spasm 05/27/2015  . Avitaminosis D 05/27/2015    Allergies  Allergen Reactions  . Diazepam Other (See Comments)    Other reaction(s): pruritis  . Diflucan [Fluconazole] Swelling    Lips swell  . Metronidazole Other (See Comments)    Other reaction(s): Angioedema  . Milk-Related Compounds Nausea And Vomiting  . Sulfa Antibiotics Hives  . Zyrtec [Cetirizine] Hives  . Latex Rash    Past Surgical History:  Procedure Laterality Date  . BUNIONECTOMY Bilateral 2013, 2014  . COLPOSCOPY  2011    Social History   Tobacco Use  . Smoking status: Never Smoker  . Smokeless tobacco: Never Used  Substance Use Topics  . Alcohol use: Not Currently    Alcohol/week: 0.0 standard drinks    Comment: social  . Drug use: No     Medication list has been reviewed and updated.  Current Meds  Medication Sig  .  albuterol (ACCUNEB) 1.25 MG/3ML nebulizer solution Take 3 mLs (1.25 mg total) by nebulization every 6 (six) hours as needed for wheezing.  Marland Kitchen albuterol (PROVENTIL HFA;VENTOLIN HFA) 108 (90 Base) MCG/ACT inhaler Inhale 2 puffs into the lungs every 6 (six) hours as needed for wheezing or shortness of breath.  . Cholecalciferol (VITAMIN D3) 5000 units CAPS Take 1 capsule by mouth daily.   . citalopram (CELEXA) 20 MG tablet Take 40 mg by mouth at bedtime.   . fluticasone (FLOVENT HFA) 44 MCG/ACT inhaler Inhale 1 puff into the lungs  daily.  . Fluticasone-Salmeterol (ADVAIR DISKUS) 500-50 MCG/DOSE AEPB Inhale 1 puff into the lungs 2 (two) times daily.  Marland Kitchen ibuprofen (ADVIL) 600 MG tablet Take 1 tablet (600 mg total) by mouth every 6 (six) hours.  . montelukast (SINGULAIR) 10 MG tablet Take 10 mg by mouth at bedtime.  . Prenatal Vit-Fe Fumarate-FA (PRENATAL MULTIVITAMIN) TABS tablet Take 1 tablet by mouth daily at 12 noon.   Current Facility-Administered Medications for the 02/14/20 encounter (Office Visit) with Reubin Milan, MD  Medication  . ipratropium-albuterol (DUONEB) 0.5-2.5 (3) MG/3ML nebulizer solution 3 mL    PHQ 2/9 Scores 02/14/2020 12/15/2017 06/29/2016 06/01/2016  PHQ - 2 Score 6 0 2 6  PHQ- 9 Score 17 0 7 22    BP Readings from Last 3 Encounters:  02/14/20 124/78  06/19/19 (!) 108/58  03/06/19 107/74    Physical Exam Vitals and nursing note reviewed.  Constitutional:      General: She is not irritable.She is not in acute distress.    Appearance: She is well-developed.  HENT:     Head: Normocephalic and atraumatic.  Cardiovascular:     Rate and Rhythm: Normal rate and regular rhythm.  Pulmonary:     Effort: Pulmonary effort is normal. No respiratory distress.     Breath sounds: No wheezing or rhonchi.  Musculoskeletal:        General: Normal range of motion.     Cervical back: Normal range of motion.  Lymphadenopathy:     Cervical: No cervical adenopathy.  Skin:    General: Skin is warm and dry.     Findings: No rash.     Comments: Hair thinning significantly on vertex, no obvious scalp lesions.  No area is completed bald  Neurological:     Mental Status: She is alert and oriented to person, place, and time.  Psychiatric:        Behavior: Behavior normal.        Thought Content: Thought content normal.     Wt Readings from Last 3 Encounters:  02/14/20 231 lb (104.8 kg)  06/17/19 226 lb 13.7 oz (102.9 kg)  03/06/19 203 lb (92.1 kg)    BP 124/78   Pulse (!) 102   Ht 5\' 7"   (1.702 m)   Wt 231 lb (104.8 kg)   SpO2 97%   BMI 36.18 kg/m   Assessment and Plan: 1. Alopecia Will need Dermatology evaluation Check thyroid labs - Ambulatory referral to Dermatology - TSH + free T4  2. Depression, major, single episode, in partial remission (HCC) Will decrease celexa to 20 mg for several days then stop and begin Trintellix 5 mg x 7 days then 10 mg per day (sample given)   Partially dictated using . Any errors are unintentional.  Animal nutritionist, MD [facility]  02/14/2020

## 2020-02-14 NOTE — Patient Instructions (Signed)
Take 1/2 celexa for 3 days then start Trintellix 5 mg for one week then increase to 10 mg daily.

## 2020-02-29 ENCOUNTER — Telehealth: Payer: Self-pay | Admitting: Internal Medicine

## 2020-02-29 NOTE — Telephone Encounter (Signed)
Please call patient to see why she has not had her labs done.

## 2020-03-04 NOTE — Telephone Encounter (Signed)
Called and reminded patient to get her labs done. She said "ok" and hung up.  CM

## 2020-03-13 DIAGNOSIS — L648 Other androgenic alopecia: Secondary | ICD-10-CM | POA: Diagnosis not present

## 2020-03-13 DIAGNOSIS — L668 Other cicatricial alopecia: Secondary | ICD-10-CM | POA: Diagnosis not present

## 2020-03-14 ENCOUNTER — Encounter: Payer: Self-pay | Admitting: Internal Medicine

## 2020-03-14 DIAGNOSIS — L649 Androgenic alopecia, unspecified: Secondary | ICD-10-CM | POA: Insufficient documentation

## 2020-03-15 DIAGNOSIS — L648 Other androgenic alopecia: Secondary | ICD-10-CM | POA: Diagnosis not present

## 2020-03-15 DIAGNOSIS — L659 Nonscarring hair loss, unspecified: Secondary | ICD-10-CM | POA: Diagnosis not present

## 2020-03-16 LAB — TSH+FREE T4
Free T4: 1.19 ng/dL (ref 0.82–1.77)
TSH: 0.807 u[IU]/mL (ref 0.450–4.500)

## 2020-04-29 DIAGNOSIS — L2389 Allergic contact dermatitis due to other agents: Secondary | ICD-10-CM | POA: Diagnosis not present

## 2020-06-03 ENCOUNTER — Encounter: Payer: Self-pay | Admitting: Internal Medicine

## 2020-06-03 ENCOUNTER — Ambulatory Visit: Payer: Medicaid Other | Admitting: Internal Medicine

## 2020-06-03 ENCOUNTER — Other Ambulatory Visit: Payer: Self-pay

## 2020-06-03 VITALS — BP 142/84 | HR 94 | Temp 98.5°F | Resp 14 | Ht 67.0 in | Wt 230.0 lb

## 2020-06-03 DIAGNOSIS — Z111 Encounter for screening for respiratory tuberculosis: Secondary | ICD-10-CM | POA: Diagnosis not present

## 2020-06-03 DIAGNOSIS — J4520 Mild intermittent asthma, uncomplicated: Secondary | ICD-10-CM

## 2020-06-03 DIAGNOSIS — Z1159 Encounter for screening for other viral diseases: Secondary | ICD-10-CM

## 2020-06-03 DIAGNOSIS — F324 Major depressive disorder, single episode, in partial remission: Secondary | ICD-10-CM

## 2020-06-03 DIAGNOSIS — Z0000 Encounter for general adult medical examination without abnormal findings: Secondary | ICD-10-CM | POA: Diagnosis not present

## 2020-06-03 DIAGNOSIS — R030 Elevated blood-pressure reading, without diagnosis of hypertension: Secondary | ICD-10-CM

## 2020-06-03 LAB — POCT URINALYSIS DIPSTICK
Bilirubin, UA: NEGATIVE
Blood, UA: NEGATIVE
Glucose, UA: NEGATIVE
Ketones, UA: NEGATIVE
Leukocytes, UA: NEGATIVE
Nitrite, UA: NEGATIVE
Protein, UA: NEGATIVE
Spec Grav, UA: 1.015 (ref 1.010–1.025)
Urobilinogen, UA: 0.2 E.U./dL
pH, UA: 6.5 (ref 5.0–8.0)

## 2020-06-03 MED ORDER — MONTELUKAST SODIUM 10 MG PO TABS: 10.0000 mg | ORAL_TABLET | Freq: Every day | ORAL | 1 refills | Status: AC

## 2020-06-03 NOTE — Progress Notes (Signed)
Date:  06/03/2020   Name:  Beverly Thomas   DOB:  [birth year]   MRN:  [id number]   Chief Complaint: Annual Exam (brast exam (breast feeding)/ no pap/ can she take cingulair and allegra since not pregnant) Beverly Thomas is a 39 y.o. female who presents today for her Complete Annual Exam. She feels fairly well. She reports exercising walk 5-6 times a week 2.5 miles a day. She reports she is sleeping fairly well. She denies breast issues - still breastfeeding.  Pap 12/2015 neg with cotesting Immunization History  Administered Date(s) Administered  . Tdap 12/21/2008    Asthma There is no cough, shortness of breath or wheezing. Associated symptoms include headaches (on and off). Pertinent negatives include no chest pain, fever or trouble swallowing. Her symptoms are alleviated by leukotriene antagonist, steroid inhaler and beta-agonist. Her past medical history is significant for asthma.  Depression        This is a chronic problem.The problem is unchanged.  Associated symptoms include headaches (on and off).  Associated symptoms include no fatigue.  Past treatments include SSRIs - Selective serotonin reuptake inhibitors.  Compliance with treatment is good.   Lab Results  Component Value Date   CREATININE 0.51 12/31/2018   BUN <5 (L) 12/31/2018   NA 136 12/31/2018   K 3.6 12/31/2018   CL 106 12/31/2018   CO2 23 12/31/2018   Lab Results  Component Value Date   CHOL 187 06/29/2016   HDL 66 06/29/2016   LDLCALC 104 (H) 06/29/2016   TRIG 84 06/29/2016   CHOLHDL 2.8 06/29/2016   Lab Results  Component Value Date   TSH 0.807 03/15/2020   No results found for: HGBA1C Lab Results  Component Value Date   WBC 16.4 (H) 06/18/2019   HGB 9.5 (L) 06/18/2019   HCT 30.6 (L) 06/18/2019   MCV 79.3 (L) 06/18/2019   PLT 286 06/18/2019   Lab Results  Component Value Date   ALT 16 12/31/2018   AST 19 12/31/2018   ALKPHOS 37 (L) 12/31/2018   BILITOT 0.4 12/31/2018     Review of  Systems  Constitutional: Negative for chills, fatigue and fever.  HENT: Negative for congestion, hearing loss, tinnitus, trouble swallowing and voice change.   Eyes: Negative for visual disturbance.  Respiratory: Negative for cough, chest tightness, shortness of breath and wheezing.   Cardiovascular: Negative for chest pain, palpitations and leg swelling.  Gastrointestinal: Negative for abdominal pain, constipation, diarrhea and vomiting.  Endocrine: Negative for polydipsia and polyuria.  Genitourinary: Negative for dysuria, frequency, genital sores, vaginal bleeding and vaginal discharge.  Musculoskeletal: Negative for arthralgias, gait problem and joint swelling.  Skin: Negative for color change and rash.  Neurological: Positive for headaches (on and off). Negative for dizziness, tremors and light-headedness.  Hematological: Negative for adenopathy. Does not bruise/bleed easily.  Psychiatric/Behavioral: Positive for depression and sleep disturbance. Negative for dysphoric mood. The patient is nervous/anxious.     Patient Active Problem List   Diagnosis Date Noted  . 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 Obesity (BMI 30.0-34.9) 06/15/2018  . Cyst, sebaceous, breast 06/29/2016  . H/O abnormal cervical Papanicolaou smear 05/27/2015  . Allergic state 05/27/2015  . 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 Asthma, mild intermittent 05/27/2015  . Back muscle spasm 05/27/2015  . Avitaminosis D 05/27/2015    Allergies  Allergen Reactions  . Diazepam Other (See Comments)    Other reaction(s): pruritis  . Diflucan [Fluconazole] Swelling  Lips swell  . Metronidazole Other (See Comments)    Other reaction(s): Angioedema  . Milk-Related Compounds Nausea And Vomiting  . Sulfa Antibiotics Hives  . Zyrtec [Cetirizine] Hives  . Latex Rash    Past Surgical History:  Procedure Laterality Date  . BUNIONECTOMY Bilateral 2013, 2014  . COLPOSCOPY  2011     Social History   Tobacco Use  . Smoking status: Never Smoker  . Smokeless tobacco: Never Used  Vaping Use  . Vaping Use: Never used  Substance Use Topics  . Alcohol use: Not Currently    Alcohol/week: 0.0 standard drinks    Comment: social  . Drug use: No     Medication list has been reviewed and updated.  Current Meds  Medication Sig  . albuterol (ACCUNEB) 1.25 MG/3ML nebulizer solution Take 3 mLs (1.25 mg total) by nebulization every 6 (six) hours as needed for wheezing.  Marland Kitchen albuterol (PROVENTIL HFA;VENTOLIN HFA) 108 (90 Base) MCG/ACT inhaler Inhale 2 puffs into the lungs every 6 (six) hours as needed for wheezing or shortness of breath.  . Cholecalciferol (VITAMIN D3) 5000 units CAPS Take 1 capsule by mouth daily.   . citalopram (CELEXA) 20 MG tablet Take 40 mg by mouth at bedtime.   . fluticasone (FLOVENT HFA) 44 MCG/ACT inhaler Inhale 1 puff into the lungs daily.  . Fluticasone-Salmeterol (ADVAIR DISKUS) 500-50 MCG/DOSE AEPB Inhale 1 puff into the lungs 2 (two) times daily.  Marland Kitchen ibuprofen (ADVIL) 600 MG tablet Take 1 tablet (600 mg total) by mouth every 6 (six) hours.   Current Facility-Administered Medications for the 06/03/20 encounter (Office Visit) with Glean Hess, MD  Medication  . ipratropium-albuterol (DUONEB) 0.5-2.5 (3) MG/3ML nebulizer solution 3 mL    PHQ 2/9 Scores 06/03/2020 02/14/2020 12/15/2017 06/29/2016  PHQ - 2 Score 2 6 0 2  PHQ- 9 Score 5 17 0 7    GAD 7 : Generalized Anxiety Score 06/03/2020 06/01/2016  Nervous, Anxious, on Edge 1 3  Control/stop worrying 2 3  Worry too much - different things 2 3  Trouble relaxing 1 3  Restless 1 3  Easily annoyed or irritable 3 3  Afraid - awful might happen 0 3  Total GAD 7 Score 10 21  Anxiety Difficulty Somewhat difficult -    BP Readings from Last 3 Encounters:  06/03/20 (!) 142/84  02/14/20 124/78  06/19/19 (!) 108/58    Physical Exam Vitals and nursing note reviewed.  Constitutional:       General: She is not in acute distress.    Appearance: She is well-developed.  HENT:     Head: Normocephalic and atraumatic.     Right Ear: Tympanic membrane and ear canal normal.     Left Ear: Tympanic membrane and ear canal normal.     Nose:     Right Sinus: No maxillary sinus tenderness.     Left Sinus: No maxillary sinus tenderness.  Eyes:     General: No scleral icterus.       Right eye: No discharge.        Left eye: No discharge.     Conjunctiva/sclera: Conjunctivae normal.  Neck:     Thyroid: No thyromegaly.     Vascular: No carotid bruit.  Cardiovascular:     Rate and Rhythm: Normal rate and regular rhythm.     Pulses: Normal pulses.     Heart sounds: Normal heart sounds.  Pulmonary:     Effort: Pulmonary effort is normal. No respiratory  distress.     Breath sounds: No wheezing.  Chest:     Breasts:        Right: No mass, skin change or tenderness.        Left: No mass, skin change or tenderness.  Abdominal:     General: Bowel sounds are normal.     Palpations: Abdomen is soft.     Tenderness: There is no abdominal tenderness.  Musculoskeletal:        General: Normal range of motion.     Cervical back: Normal range of motion. No erythema.  Lymphadenopathy:     Cervical: No cervical adenopathy.  Skin:    General: Skin is warm and dry.     Findings: No rash.  Neurological:     Mental Status: She is alert and oriented to person, place, and time.     Cranial Nerves: No cranial nerve deficit.     Sensory: No sensory deficit.     Deep Tendon Reflexes: Reflexes are normal and symmetric.  Psychiatric:        Speech: Speech normal.        Behavior: Behavior normal.        Thought Content: Thought content normal.     Wt Readings from Last 3 Encounters:  06/03/20 230 lb (104.3 kg)  02/14/20 231 lb (104.8 kg)  06/17/19 226 lb 13.7 oz (102.9 kg)    BP (!) 142/84   Pulse 94   Temp 98.5 F (36.9 C) (Oral)   Resp 14   Ht 5\' 7"  (1.702 m)   Wt 230 lb (104.3 kg)    SpO2 97%   BMI 36.02 kg/m   Assessment and Plan: 1. Annual physical exam Normal exam except for weight and BP Discussed diet options; continue exercise - CBC with Differential/Platelet - Comprehensive metabolic panel - Lipid panel - POCT urinalysis dipstick  2. Need for hepatitis C screening test - Hepatitis C antibody  3. Mild intermittent asthma without complication Asthma is well controlled on Advair.  Using albuterol about 2 times per month. Would like to resume singulair and Allegra which were stopped during her pregnancy. - montelukast (SINGULAIR) 10 MG tablet; Take 1 tablet (10 mg total) by mouth at bedtime.  Dispense: 90 tablet; Refill: 1  4. Depression, major, single episode, in partial remission (HCC) Clinically stable on current regimen with good control of symptoms, No SI or HI. Will continue current therapy. - TSH  5. Elevated blood pressure reading DASH diet discussed and sheet provided Weight loss and continued exercise Sodium restriction Follow up in 4 months  6. Screening for tuberculosis Needs TB screening for work Employment form completed - QuantiFERON-TB Gold Plus   Partially . Any errors are unintentional.  Contractor, MD [facility]  06/03/2020

## 2020-06-03 NOTE — Patient Instructions (Signed)
DASH Eating Plan DASH stands for "Dietary Approaches to Stop Hypertension." The DASH eating plan is a healthy eating plan that has been shown to reduce high blood pressure (hypertension). It may also reduce your risk for type 2 diabetes, heart disease, and stroke. The DASH eating plan may also help with weight loss. What are tips for following this plan?  General guidelines  Avoid eating more than 2,300 mg (milligrams) of salt (sodium) a day. If you have hypertension, you may need to reduce your sodium intake to 1,500 mg a day.  Limit alcohol intake to no more than 1 drink a day for nonpregnant women and 2 drinks a day for men. One drink equals 12 oz of beer, 5 oz of wine, or 1 oz of hard liquor.  Work with your health care provider to maintain a healthy body weight or to lose weight. Ask what an ideal weight is for you.  Get at least 30 minutes of exercise that causes your heart to beat faster (aerobic exercise) most days of the week. Activities may include walking, swimming, or biking.  Work with your health care provider or diet and nutrition specialist (dietitian) to adjust your eating plan to your individual calorie needs. Reading food labels   Check food labels for the amount of sodium per serving. Choose foods with less than 5 percent of the Daily Value of sodium. Generally, foods with less than 300 mg of sodium per serving fit into this eating plan.  To find whole grains, look for the word "whole" as the first word in the ingredient list. Shopping  Buy products labeled as "low-sodium" or "no salt added."  Buy fresh foods. Avoid canned foods and premade or frozen meals. Cooking  Avoid adding salt when cooking. Use salt-free seasonings or herbs instead of table salt or sea salt. Check with your health care provider or pharmacist before using salt substitutes.  Do not fry foods. Cook foods using healthy methods such as baking, boiling, grilling, and broiling instead.  Cook with  heart-healthy oils, such as olive, canola, soybean, or sunflower oil. Meal planning  Eat a balanced diet that includes: ? 5 or more servings of fruits and vegetables each day. At each meal, try to fill half of your plate with fruits and vegetables. ? Up to 6-8 servings of whole grains each day. ? Less than 6 oz of lean meat, poultry, or fish each day. A 3-oz serving of meat is about the same size as a deck of cards. One egg equals 1 oz. ? 2 servings of low-fat dairy each day. ? A serving of nuts, seeds, or beans 5 times each week. ? Heart-healthy fats. Healthy fats called Omega-3 fatty acids are found in foods such as flaxseeds and coldwater fish, like sardines, salmon, and mackerel.  Limit how much you eat of the following: ? Canned or prepackaged foods. ? Food that is high in trans fat, such as fried foods. ? Food that is high in saturated fat, such as fatty meat. ? Sweets, desserts, sugary drinks, and other foods with added sugar. ? Full-fat dairy products.  Do not salt foods before eating.  Try to eat at least 2 vegetarian meals each week.  Eat more home-cooked food and less restaurant, buffet, and fast food.  When eating at a restaurant, ask that your food be prepared with less salt or no salt, if possible. What foods are recommended? The items listed may not be a complete list. Talk with your dietitian about   what dietary choices are best for you. Grains Whole-grain or whole-wheat bread. Whole-grain or whole-wheat pasta. Brown rice. Oatmeal. Quinoa. Bulgur. Whole-grain and low-sodium cereals. Pita bread. Low-fat, low-sodium crackers. Whole-wheat flour tortillas. Vegetables Fresh or frozen vegetables (raw, steamed, roasted, or grilled). Low-sodium or reduced-sodium tomato and vegetable juice. Low-sodium or reduced-sodium tomato sauce and tomato paste. Low-sodium or reduced-sodium canned vegetables. Fruits All fresh, dried, or frozen fruit. Canned fruit in natural juice (without  added sugar). Meat and other protein foods Skinless chicken or turkey. Ground chicken or turkey. Pork with fat trimmed off. Fish and seafood. Egg whites. Dried beans, peas, or lentils. Unsalted nuts, nut butters, and seeds. Unsalted canned beans. Lean cuts of beef with fat trimmed off. Low-sodium, lean deli meat. Dairy Low-fat (1%) or fat-free (skim) milk. Fat-free, low-fat, or reduced-fat cheeses. Nonfat, low-sodium ricotta or cottage cheese. Low-fat or nonfat yogurt. Low-fat, low-sodium cheese. Fats and oils Soft margarine without trans fats. Vegetable oil. Low-fat, reduced-fat, or light mayonnaise and salad dressings (reduced-sodium). Canola, safflower, olive, soybean, and sunflower oils. Avocado. Seasoning and other foods Herbs. Spices. Seasoning mixes without salt. Unsalted popcorn and pretzels. Fat-free sweets. What foods are not recommended? The items listed may not be a complete list. Talk with your dietitian about what dietary choices are best for you. Grains Baked goods made with fat, such as croissants, muffins, or some breads. Dry pasta or rice meal packs. Vegetables Creamed or fried vegetables. Vegetables in a cheese sauce. Regular canned vegetables (not low-sodium or reduced-sodium). Regular canned tomato sauce and paste (not low-sodium or reduced-sodium). Regular tomato and vegetable juice (not low-sodium or reduced-sodium). Pickles. Olives. Fruits Canned fruit in a light or heavy syrup. Fried fruit. Fruit in cream or butter sauce. Meat and other protein foods Fatty cuts of meat. Ribs. Fried meat. Bacon. Sausage. Bologna and other processed lunch meats. Salami. Fatback. Hotdogs. Bratwurst. Salted nuts and seeds. Canned beans with added salt. Canned or smoked fish. Whole eggs or egg yolks. Chicken or turkey with skin. Dairy Whole or 2% milk, cream, and half-and-half. Whole or full-fat cream cheese. Whole-fat or sweetened yogurt. Full-fat cheese. Nondairy creamers. Whipped toppings.  Processed cheese and cheese spreads. Fats and oils Butter. Stick margarine. Lard. Shortening. Ghee. Bacon fat. Tropical oils, such as coconut, palm kernel, or palm oil. Seasoning and other foods Salted popcorn and pretzels. Onion salt, garlic salt, seasoned salt, table salt, and sea salt. Worcestershire sauce. Tartar sauce. Barbecue sauce. Teriyaki sauce. Soy sauce, including reduced-sodium. Steak sauce. Canned and packaged gravies. Fish sauce. Oyster sauce. Cocktail sauce. Horseradish that you find on the shelf. Ketchup. Mustard. Meat flavorings and tenderizers. Bouillon cubes. Hot sauce and Tabasco sauce. Premade or packaged marinades. Premade or packaged taco seasonings. Relishes. Regular salad dressings. Where to find more information:  National Heart, Lung, and Blood Institute: www.nhlbi.nih.gov  American Heart Association: www.heart.org Summary  The DASH eating plan is a healthy eating plan that has been shown to reduce high blood pressure (hypertension). It may also reduce your risk for type 2 diabetes, heart disease, and stroke.  With the DASH eating plan, you should limit salt (sodium) intake to 2,300 mg a day. If you have hypertension, you may need to reduce your sodium intake to 1,500 mg a day.  When on the DASH eating plan, aim to eat more fresh fruits and vegetables, whole grains, lean proteins, low-fat dairy, and heart-healthy fats.  Work with your health care provider or diet and nutrition specialist (dietitian) to adjust your eating plan to your   individual calorie needs. This information is not intended to replace advice given to you by your health care provider. Make sure you discuss any questions you have with your health care provider. Document Revised: 11/19/2017 Document Reviewed: 11/30/2016 Elsevier Patient Education  2020 Elsevier Inc.  

## 2020-06-05 LAB — CBC WITH DIFFERENTIAL/PLATELET
Basophils Absolute: 0 10*3/uL (ref 0.0–0.2)
Basos: 1 %
EOS (ABSOLUTE): 0.2 10*3/uL (ref 0.0–0.4)
Eos: 3 %
Hematocrit: 38.7 % (ref 34.0–46.6)
Hemoglobin: 12.6 g/dL (ref 11.1–15.9)
Immature Grans (Abs): 0 10*3/uL (ref 0.0–0.1)
Immature Granulocytes: 0 %
Lymphocytes Absolute: 1.3 10*3/uL (ref 0.7–3.1)
Lymphs: 28 %
MCH: 27.6 pg (ref 26.6–33.0)
MCHC: 32.6 g/dL (ref 31.5–35.7)
MCV: 85 fL (ref 79–97)
Monocytes Absolute: 0.6 10*3/uL (ref 0.1–0.9)
Monocytes: 14 %
Neutrophils Absolute: 2.6 10*3/uL (ref 1.4–7.0)
Neutrophils: 54 %
Platelets: 293 10*3/uL (ref 150–450)
RBC: 4.56 x10E6/uL (ref 3.77–5.28)
RDW: 13.3 % (ref 11.7–15.4)
WBC: 4.7 10*3/uL (ref 3.4–10.8)

## 2020-06-05 LAB — COMPREHENSIVE METABOLIC PANEL
ALT: 6 IU/L (ref 0–32)
AST: 17 IU/L (ref 0–40)
Albumin/Globulin Ratio: 1.7 (ref 1.2–2.2)
Albumin: 4.3 g/dL (ref 3.8–4.8)
Alkaline Phosphatase: 95 IU/L (ref 48–121)
BUN/Creatinine Ratio: 14 (ref 9–23)
BUN: 10 mg/dL (ref 6–20)
Bilirubin Total: 0.7 mg/dL (ref 0.0–1.2)
CO2: 22 mmol/L (ref 20–29)
Calcium: 8.9 mg/dL (ref 8.7–10.2)
Chloride: 103 mmol/L (ref 96–106)
Creatinine, Ser: 0.73 mg/dL (ref 0.57–1.00)
GFR calc Af Amer: 121 mL/min/{1.73_m2} (ref >59)
GFR calc non Af Amer: 105 mL/min/{1.73_m2} (ref >59)
Globulin, Total: 2.5 g/dL (ref 1.5–4.5)
Glucose: 84 mg/dL (ref 65–99)
Potassium: 4 mmol/L (ref 3.5–5.2)
Sodium: 139 mmol/L (ref 134–144)
Total Protein: 6.8 g/dL (ref 6.0–8.5)

## 2020-06-05 LAB — LIPID PANEL
Chol/HDL Ratio: 3.1 ratio (ref 0.0–4.4)
Cholesterol, Total: 222 mg/dL — ABNORMAL HIGH (ref 100–199)
HDL: 71 mg/dL (ref >39)
LDL Chol Calc (NIH): 138 mg/dL — ABNORMAL HIGH (ref 0–99)
Triglycerides: 77 mg/dL (ref 0–149)
VLDL Cholesterol Cal: 13 mg/dL (ref 5–40)

## 2020-06-05 LAB — QUANTIFERON-TB GOLD PLUS
QuantiFERON Mitogen Value: >10 IU/mL
QuantiFERON Nil Value: 0 IU/mL
QuantiFERON TB1 Ag Value: 0.03 IU/mL
QuantiFERON TB2 Ag Value: 0.04 IU/mL
QuantiFERON-TB Gold Plus: NEGATIVE

## 2020-06-05 LAB — TSH: TSH: 0.877 u[IU]/mL (ref 0.450–4.500)

## 2020-06-05 LAB — HEPATITIS C ANTIBODY: Hep C Virus Ab: 0.1 s/co ratio (ref 0.0–0.9)

## 2020-08-01 ENCOUNTER — Other Ambulatory Visit: Payer: Self-pay | Admitting: Internal Medicine

## 2020-08-01 DIAGNOSIS — J4520 Mild intermittent asthma, uncomplicated: Secondary | ICD-10-CM

## 2020-08-01 NOTE — Telephone Encounter (Signed)
Medication Refill - Medication: montelukast (SINGULAIR) 10 MG tablet  Patient is all out of her medication   Preferred Pharmacy (with phone number or street name):  [id number] [place], [place] - [address] AT [facility] [address] Phone:  [phone]  Fax:  [phone]       Agent: Please be advised that RX refills may take up to 3 business days. We ask that you follow-up with your pharmacy.

## 2020-08-01 NOTE — Telephone Encounter (Signed)
Pharmacy has script on file from 06/03/2020 and will fill for patient

## 2020-08-09 ENCOUNTER — Encounter: Payer: Self-pay | Admitting: Internal Medicine

## 2020-08-09 ENCOUNTER — Other Ambulatory Visit: Payer: Self-pay

## 2020-08-09 ENCOUNTER — Ambulatory Visit: Payer: Medicaid Other | Admitting: Internal Medicine

## 2020-08-09 VITALS — BP 124/88 | HR 95 | Temp 98.5°F | Ht 67.0 in | Wt 226.0 lb

## 2020-08-09 DIAGNOSIS — J45901 Unspecified asthma with (acute) exacerbation: Secondary | ICD-10-CM | POA: Diagnosis not present

## 2020-08-09 DIAGNOSIS — J4521 Mild intermittent asthma with (acute) exacerbation: Secondary | ICD-10-CM

## 2020-08-09 MED ORDER — AZITHROMYCIN 250 MG PO TABS: ORAL_TABLET | ORAL | 0 refills | Status: AC

## 2020-08-09 MED ORDER — FLUTICASONE PROPIONATE HFA 44 MCG/ACT IN AERO: 2.0000 | INHALATION_SPRAY | Freq: Every day | RESPIRATORY_TRACT | 5 refills | Status: AC

## 2020-08-09 MED ORDER — ALBUTEROL SULFATE HFA 108 (90 BASE) MCG/ACT IN AERS: 2.0000 | INHALATION_SPRAY | Freq: Four times a day (QID) | RESPIRATORY_TRACT | 5 refills | Status: AC | PRN

## 2020-08-09 MED ORDER — GUAIFENESIN-CODEINE 100-10 MG/5ML PO SYRP: 5.0000 mL | ORAL_SOLUTION | Freq: Three times a day (TID) | ORAL | 0 refills | Status: DC | PRN

## 2020-08-09 MED ORDER — PREDNISONE 10 MG PO TABS: 10.0000 mg | ORAL_TABLET | ORAL | 0 refills | Status: AC

## 2020-08-09 NOTE — Progress Notes (Signed)
Date:  08/09/2020   Name:  Beverly Thomas   DOB:  01/28/1981   MRN:  [id number]   Chief Complaint: Asthma (x3 weeks, wheezing, SOB, cough)  Asthma She complains of chest tightness, cough, shortness of breath and wheezing. There is no hemoptysis or sputum production. This is a recurrent problem. The problem occurs constantly. The problem has been unchanged. Pertinent negatives include no chest pain, fever, headaches or trouble swallowing. Her symptoms are alleviated by leukotriene antagonist, steroid inhaler and beta-agonist. She reports moderate improvement on treatment. Her past medical history is significant for asthma.    Lab Results  Component Value Date   CREATININE 0.73 06/03/2020   BUN 10 06/03/2020   NA 139 06/03/2020   K 4.0 06/03/2020   CL 103 06/03/2020   CO2 22 06/03/2020   Lab Results  Component Value Date   CHOL 222 (H) 06/03/2020   HDL 71 06/03/2020   LDLCALC 138 (H) 06/03/2020   TRIG 77 06/03/2020   CHOLHDL 3.1 06/03/2020   Lab Results  Component Value Date   TSH 0.877 06/03/2020   No results found for: HGBA1C Lab Results  Component Value Date   WBC 4.7 06/03/2020   HGB 12.6 06/03/2020   HCT 38.7 06/03/2020   MCV 85 06/03/2020   PLT 293 06/03/2020   Lab Results  Component Value Date   ALT 6 06/03/2020   AST 17 06/03/2020   ALKPHOS 95 06/03/2020   BILITOT 0.7 06/03/2020     Review of Systems  Constitutional: Negative for chills, fatigue and fever.  HENT: Negative for trouble swallowing.   Respiratory: Positive for cough, shortness of breath and wheezing. Negative for hemoptysis and sputum production.   Cardiovascular: Negative for chest pain and palpitations.  Neurological: Negative for dizziness and headaches.    Patient Active Problem List   Diagnosis Date Noted  . Elevated blood pressure reading 06/03/2020  . 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 Obesity (BMI 30.0-34.9) 06/15/2018  . Cyst, sebaceous, breast 06/29/2016  . H/O abnormal  cervical Papanicolaou smear 05/27/2015  . Allergic state 05/27/2015  . 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 Asthma, mild intermittent 05/27/2015  . Back muscle spasm 05/27/2015  . Avitaminosis D 05/27/2015    Allergies  Allergen Reactions  . Diazepam Other (See Comments)    Other reaction(s): pruritis  . Diflucan [Fluconazole] Swelling    Lips swell  . Metronidazole Other (See Comments)    Other reaction(s): Angioedema  . Milk-Related Compounds Nausea And Vomiting  . Sulfa Antibiotics Hives  . Zyrtec [Cetirizine] Hives  . Latex Rash    Past Surgical History:  Procedure Laterality Date  . BUNIONECTOMY Bilateral 2013, 2014  . COLPOSCOPY  2011    Social History   Tobacco Use  . Smoking status: Never Smoker  . Smokeless tobacco: Never Used  Vaping Use  . Vaping Use: Never used  Substance Use Topics  . Alcohol use: Not Currently    Alcohol/week: 0.0 standard drinks    Comment: social  . Drug use: No     Medication list has been reviewed and updated.  Current Meds  Medication Sig  . albuterol (ACCUNEB) 1.25 MG/3ML nebulizer solution Take 3 mLs (1.25 mg total) by nebulization every 6 (six) hours as needed for wheezing.  Marland Kitchen albuterol (PROVENTIL HFA;VENTOLIN HFA) 108 (90 Base) MCG/ACT inhaler Inhale 2 puffs into the lungs every 6 (six) hours as needed for wheezing or shortness of breath.  . Cholecalciferol (  VITAMIN D3) 5000 units CAPS Take 1 capsule by mouth daily.   . citalopram (CELEXA) 20 MG tablet Take 40 mg by mouth at bedtime.   . fluticasone (FLOVENT HFA) 44 MCG/ACT inhaler Inhale 1 puff into the lungs daily.  . Fluticasone-Salmeterol (ADVAIR DISKUS) 500-50 MCG/DOSE AEPB Inhale 1 puff into the lungs 2 (two) times daily.  Marland Kitchen ibuprofen (ADVIL) 600 MG tablet Take 1 tablet (600 mg total) by mouth every 6 (six) hours.  . montelukast (SINGULAIR) 10 MG tablet Take 1 tablet (10 mg total) by mouth at bedtime.  . Vitamin D,  Ergocalciferol, (DRISDOL) 1.25 MG (50000 UNIT) CAPS capsule Take 50,000 Units by mouth 2 (two) times a week.   Current Facility-Administered Medications for the 08/09/20 encounter (Office Visit) with Reubin Milan, MD  Medication  . ipratropium-albuterol (DUONEB) 0.5-2.5 (3) MG/3ML nebulizer solution 3 mL    PHQ 2/9 Scores 06/03/2020 02/14/2020 12/15/2017 06/29/2016  PHQ - 2 Score 2 6 0 2  PHQ- 9 Score 5 17 0 7    GAD 7 : Generalized Anxiety Score 06/03/2020 06/01/2016  Nervous, Anxious, on Edge 1 3  Control/stop worrying 2 3  Worry too much - different things 2 3  Trouble relaxing 1 3  Restless 1 3  Easily annoyed or irritable 3 3  Afraid - awful might happen 0 3  Total GAD 7 Score 10 21  Anxiety Difficulty Somewhat difficult -    BP Readings from Last 3 Encounters:  08/09/20 124/88  06/03/20 (!) 142/84  02/14/20 124/78    Physical Exam Vitals and nursing note reviewed.  Constitutional:      General: She is not in acute distress.    Appearance: Normal appearance. She is well-developed.  HENT:     Head: Normocephalic and atraumatic.  Cardiovascular:     Rate and Rhythm: Normal rate and regular rhythm.  Pulmonary:     Effort: Pulmonary effort is normal. No respiratory distress.     Breath sounds: Wheezing present. No rhonchi.  Musculoskeletal:     Cervical back: Normal range of motion.     Right lower leg: No edema.     Left lower leg: No edema.  Lymphadenopathy:     Cervical: No cervical adenopathy.  Skin:    General: Skin is warm and dry.     Findings: No rash.  Neurological:     Mental Status: She is alert and oriented to person, place, and time.  Psychiatric:        Behavior: Behavior normal.        Thought Content: Thought content normal.     Wt Readings from Last 3 Encounters:  08/09/20 226 lb (102.5 kg)  06/03/20 230 lb (104.3 kg)  02/14/20 231 lb (104.8 kg)    BP 124/88   Pulse 95   Temp 98.5 F (36.9 C) (Oral)   Ht 5\' 7"  (1.702 m)   Wt 226  lb (102.5 kg)   SpO2 98%   BMI 35.40 kg/m   Assessment and Plan: 1. Asthma exacerbation, mild Will treat with zpak and steroid taper - azithromycin (ZITHROMAX Z-PAK) 250 MG tablet; UAD  Dispense: 6 each; Refill: 0 - predniSONE (DELTASONE) 10 MG tablet; Take 1 tablet (10 mg total) by mouth as directed for 6 days. Take 6,5,4,3,2,1 then stop  Dispense: 21 tablet; Refill: 0 - guaiFENesin-codeine (ROBITUSSIN AC) 100-10 MG/5ML syrup; Take 5 mLs by mouth 3 (three) times daily as needed for cough.  Dispense: 118 mL; Refill: 0  2.  Mild intermittent asthma with acute exacerbation Continue flovent and proair - refills given - fluticasone (FLOVENT HFA) 44 MCG/ACT inhaler; Inhale 2 puffs into the lungs daily.  Dispense: 10.6 g; Refill: 5 - albuterol (PROAIR HFA) 108 (90 Base) MCG/ACT inhaler; Inhale 2 puffs into the lungs every 6 (six) hours as needed for wheezing or shortness of breath.  Dispense: 18 g; Refill: 5   Partially dictated using Animal nutritionist. Any errors are unintentional.  Bari Edward, MD [facility]  08/09/2020

## 2020-08-22 ENCOUNTER — Encounter: Payer: Self-pay | Admitting: Internal Medicine

## 2020-08-22 ENCOUNTER — Other Ambulatory Visit: Payer: Self-pay

## 2020-08-22 ENCOUNTER — Telehealth: Payer: Self-pay

## 2020-08-22 ENCOUNTER — Ambulatory Visit (INDEPENDENT_AMBULATORY_CARE_PROVIDER_SITE_OTHER): Payer: Medicaid Other | Admitting: Internal Medicine

## 2020-08-22 ENCOUNTER — Telehealth: Payer: Self-pay | Admitting: Internal Medicine

## 2020-08-22 VITALS — Temp 97.5°F | Ht 67.0 in | Wt 223.0 lb

## 2020-08-22 DIAGNOSIS — J4520 Mild intermittent asthma, uncomplicated: Secondary | ICD-10-CM

## 2020-08-22 DIAGNOSIS — J3089 Other allergic rhinitis: Secondary | ICD-10-CM

## 2020-08-22 MED ORDER — TRELEGY ELLIPTA 100-62.5-25 MCG/INH IN AEPB: 1.0000 | INHALATION_SPRAY | Freq: Every day | RESPIRATORY_TRACT | 5 refills | Status: DC

## 2020-08-22 MED ORDER — LEVOCETIRIZINE DIHYDROCHLORIDE 5 MG PO TABS: 5.0000 mg | ORAL_TABLET | Freq: Every evening | ORAL | 3 refills | Status: DC

## 2020-08-22 NOTE — Telephone Encounter (Signed)
Copied from CRM [id number]. Topic: Appointment Scheduling - Scheduling Inquiry for Clinic >> Aug 22, 2020  8:18 AM Gwenlyn Fudge wrote: Reason for CRM: Pt called stating that she is still dealing with asthma and allergies and is requesting to have a virtual visit to discuss the symptoms. Please advise.

## 2020-08-22 NOTE — Progress Notes (Signed)
Date:  08/22/2020   Name:  Beverly Thomas   DOB:  10-Oct-1981   MRN:  [id number]   Chief Complaint: Asthma (X3-4 days, getting worse,allergies, SOB, dry cough, no wheezing, some sneezing)  Virtual Visit via Video Note  I connected with@ on 08/22/20 at 11:40 AM EDT by a video enabled telemedicine application and verified that I am speaking with the correct person using two identifiers. Tele health consent obtained by the CMA.  Location: Patient: work Provider: [employer]   I discussed the limitations of evaluation and management by telemedicine and the availability of in person appointments. The patient expressed understanding and agreed to proceed.  I discussed the assessment and treatment plan with the patient. The patient was provided an opportunity to ask questions and all were answered. The patient agreed with the plan and demonstrated an understanding of the instructions.   The patient was advised to call back or seek an in-person evaluation if the symptoms worsen or if the condition fails to improve as anticipated.  I provided 20 minutes of non-face-to-face time during this encounter.  Asthma She complains of cough. There is no chest tightness, frequent throat clearing, shortness of breath, sputum production or wheezing. This is a recurrent problem. The problem occurs constantly. The problem has been unchanged. The cough is non-productive. Associated symptoms include postnasal drip. Pertinent negatives include no chest pain, fever, nasal congestion, sore throat or trouble swallowing. Her symptoms are aggravated by change in weather and pollen. Her symptoms are alleviated by beta-agonist, leukotriene antagonist and steroid inhaler. She reports moderate improvement on treatment. Her symptoms are not alleviated by oral steroids (given taper 12 days ago). Her past medical history is significant for asthma.  Allergies - not taking any antihistamines for drainage.  Symptoms are  worse outside.  Recently completed steroid taper and Zpak for asthma which is much improved.  Lab Results  Component Value Date   CREATININE 0.73 06/03/2020   BUN 10 06/03/2020   NA 139 06/03/2020   K 4.0 06/03/2020   CL 103 06/03/2020   CO2 22 06/03/2020   Lab Results  Component Value Date   CHOL 222 (H) 06/03/2020   HDL 71 06/03/2020   LDLCALC 138 (H) 06/03/2020   TRIG 77 06/03/2020   CHOLHDL 3.1 06/03/2020   Lab Results  Component Value Date   TSH 0.877 06/03/2020   No results found for: HGBA1C Lab Results  Component Value Date   WBC 4.7 06/03/2020   HGB 12.6 06/03/2020   HCT 38.7 06/03/2020   MCV 85 06/03/2020   PLT 293 06/03/2020   Lab Results  Component Value Date   ALT 6 06/03/2020   AST 17 06/03/2020   ALKPHOS 95 06/03/2020   BILITOT 0.7 06/03/2020     Review of Systems  Constitutional: Negative for fever.  HENT: Positive for postnasal drip. Negative for sore throat and trouble swallowing.   Respiratory: Positive for cough. Negative for sputum production, shortness of breath and wheezing.   Cardiovascular: Negative for chest pain.    Patient Active Problem List   Diagnosis Date Noted  . Elevated blood pressure reading 06/03/2020  . 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 Obesity (BMI 30.0-34.9) 06/15/2018  . Cyst, sebaceous, breast 06/29/2016  . H/O abnormal cervical Papanicolaou smear 05/27/2015  . Allergic state 05/27/2015  . 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 Asthma, mild intermittent 05/27/2015  . Back muscle spasm 05/27/2015  . Avitaminosis D 05/27/2015  Allergies  Allergen Reactions  . Diazepam Other (See Comments)    Other reaction(s): pruritis  . Diflucan [Fluconazole] Swelling    Lips swell  . Metronidazole Other (See Comments)    Other reaction(s): Angioedema  . Milk-Related Compounds Nausea And Vomiting  . Sulfa Antibiotics Hives  . Zyrtec [Cetirizine] Hives  . Latex Rash    Past  Surgical History:  Procedure Laterality Date  . BUNIONECTOMY Bilateral 2013, 2014  . COLPOSCOPY  2011    Social History   Tobacco Use  . Smoking status: Never Smoker  . Smokeless tobacco: Never Used  Vaping Use  . Vaping Use: Never used  Substance Use Topics  . Alcohol use: Not Currently    Alcohol/week: 0.0 standard drinks    Comment: social  . Drug use: No     Medication list has been reviewed and updated.  Current Meds  Medication Sig  . albuterol (ACCUNEB) 1.25 MG/3ML nebulizer solution Take 3 mLs (1.25 mg total) by nebulization every 6 (six) hours as needed for wheezing.  Marland Kitchen albuterol (PROAIR HFA) 108 (90 Base) MCG/ACT inhaler Inhale 2 puffs into the lungs every 6 (six) hours as needed for wheezing or shortness of breath.  . citalopram (CELEXA) 20 MG tablet Take 40 mg by mouth at bedtime.   . fluticasone (FLOVENT HFA) 44 MCG/ACT inhaler Inhale 2 puffs into the lungs daily.  Marland Kitchen guaiFENesin-codeine (ROBITUSSIN AC) 100-10 MG/5ML syrup Take 5 mLs by mouth 3 (three) times daily as needed for cough.  Marland Kitchen ibuprofen (ADVIL) 600 MG tablet Take 1 tablet (600 mg total) by mouth every 6 (six) hours.  . montelukast (SINGULAIR) 10 MG tablet Take 1 tablet (10 mg total) by mouth at bedtime.  . Vitamin D, Ergocalciferol, (DRISDOL) 1.25 MG (50000 UNIT) CAPS capsule Take 50,000 Units by mouth 2 (two) times a week.  . [DISCONTINUED] Cholecalciferol (VITAMIN D3) 5000 units CAPS Take 1 capsule by mouth daily.    Current Facility-Administered Medications for the 08/22/20 encounter (Office Visit) with Reubin Milan, MD  Medication  . ipratropium-albuterol (DUONEB) 0.5-2.5 (3) MG/3ML nebulizer solution 3 mL    PHQ 2/9 Scores 06/03/2020 02/14/2020 12/15/2017 06/29/2016  PHQ - 2 Score 2 6 0 2  PHQ- 9 Score 5 17 0 7    GAD 7 : Generalized Anxiety Score 06/03/2020 06/01/2016  Nervous, Anxious, on Edge 1 3  Control/stop worrying 2 3  Worry too much - different things 2 3  Trouble relaxing 1 3    Restless 1 3  Easily annoyed or irritable 3 3  Afraid - awful might happen 0 3  Total GAD 7 Score 10 21  Anxiety Difficulty Somewhat difficult -    BP Readings from Last 3 Encounters:  08/09/20 124/88  06/03/20 (!) 142/84  02/14/20 124/78    Physical Exam Constitutional:      Appearance: Normal appearance.  Pulmonary:     Effort: Pulmonary effort is normal.     Comments: Occasional dry cough noted No dyspnea or respiratory distress Neurological:     General: No focal deficit present.     Mental Status: She is alert.  Psychiatric:        Attention and Perception: Attention normal.        Mood and Affect: Mood normal.     Wt Readings from Last 3 Encounters:  08/22/20 223 lb (101.2 kg)  08/09/20 226 lb (102.5 kg)  06/03/20 230 lb (104.3 kg)    Temp (!) 97.5 F (36.4 C) (Temporal)  Ht 5\' 7"  (1.702 m)   Wt 223 lb (101.2 kg)   BMI 34.93 kg/m   Assessment and Plan: 1. Mild intermittent asthma without complication Cough could be persistent asthma symptoms so will start daily preventative Continue albuterol MDI PRN and daily singulair - Fluticasone-Umeclidin-Vilant (TRELEGY ELLIPTA) 100-62.5-25 MCG/INH AEPB; Inhale 1 puff into the lungs daily.  Dispense: 60 each; Refill: 5  2. Environmental and seasonal allergies Add xyzal daily - levocetirizine (XYZAL) 5 MG tablet; Take 1 tablet (5 mg total) by mouth every evening.  Dispense: 30 tablet; Refill: 3  I spent 20 minutes on this encounter including review of chart and the encounter.  Partially dictated using 01-19-1986. Any errors are unintentional.  Animal nutritionist, MD [facility]  08/22/2020

## 2020-08-22 NOTE — Telephone Encounter (Signed)
This visit type is being conducted due to national recommendations for restrictions regarding the COVID- 19 Pandemic (e.g. social distancing) in effort to limit this patients exposure and mitigate transmission in our community. This visit type is felt to be most appropriate for this patient at this time. I connected with the patient today and received telephone consent from the patient and patient understand this consent will be good for 1 year.  KP  

## 2020-08-27 ENCOUNTER — Telehealth: Payer: Self-pay

## 2020-08-27 NOTE — Telephone Encounter (Signed)
PA completed waiting for approval from insurance.  KEY: [id number]  KP

## 2020-08-28 NOTE — Telephone Encounter (Signed)
Approved thru [employer].  Approved 08/27/2020-08/27/2021.  KP

## 2020-10-24 ENCOUNTER — Other Ambulatory Visit: Payer: Self-pay | Admitting: Internal Medicine

## 2020-10-24 DIAGNOSIS — J45901 Unspecified asthma with (acute) exacerbation: Secondary | ICD-10-CM

## 2020-10-24 NOTE — Telephone Encounter (Signed)
Requested medication (s) are due for refill today: na   Requested medication (s) are on the active medication list: no   Last refill:  not on med list   Future visit scheduled: no  Notes to clinic:  no protocol . Medication not on current med list. Is this substitute for robitussin?     Requested Prescriptions  Pending Prescriptions Disp Refills   VIRTUSSIN A/C 100-10 MG/5ML syrup [Pharmacy Med Name: VIRTUSSIN AC SY (SF&ALCOHOL FREE)] 118 mL     Sig: TAKE 5 ML BY MOUTH THREE TIMES DAILY AS NEEDED FOR COUGH      Off-Protocol Failed - 10/24/2020  5:28 PM      Failed - Medication not assigned to a protocol, review manually.      Passed - Valid encounter within last 12 months    Recent Outpatient Visits           2 months ago Mild intermittent asthma without complication   [facility] Reubin Milan, MD   2 months ago Asthma exacerbation, mild   [facility] Reubin Milan, MD   4 months ago Annual physical exam   [facility] Reubin Milan, MD   8 months ago Alopecia   [facility] Reubin Milan, MD   1 year ago Mild intermittent asthma with acute exacerbation   [facility] Reubin Milan, MD

## 2020-10-25 NOTE — Telephone Encounter (Signed)
Please Advise. Last office visit 08/22/2020. Medication is not on med list.  KP

## 2020-11-22 ENCOUNTER — Ambulatory Visit (INDEPENDENT_AMBULATORY_CARE_PROVIDER_SITE_OTHER): Payer: Medicaid Other | Admitting: Internal Medicine

## 2020-11-22 ENCOUNTER — Other Ambulatory Visit: Payer: Self-pay

## 2020-11-22 ENCOUNTER — Ambulatory Visit: Payer: Medicaid Other | Admitting: Internal Medicine

## 2020-11-22 ENCOUNTER — Encounter: Payer: Self-pay | Admitting: Internal Medicine

## 2020-11-22 VITALS — BP 130/88 | HR 81 | Temp 98.7°F | Ht 67.0 in | Wt 218.0 lb

## 2020-11-22 DIAGNOSIS — G43909 Migraine, unspecified, not intractable, without status migrainosus: Secondary | ICD-10-CM | POA: Diagnosis not present

## 2020-11-22 DIAGNOSIS — L03213 Periorbital cellulitis: Secondary | ICD-10-CM | POA: Diagnosis not present

## 2020-11-22 DIAGNOSIS — W57XXXA Bitten or stung by nonvenomous insect and other nonvenomous arthropods, initial encounter: Secondary | ICD-10-CM | POA: Diagnosis not present

## 2020-11-22 DIAGNOSIS — S50861A Insect bite (nonvenomous) of right forearm, initial encounter: Secondary | ICD-10-CM | POA: Diagnosis not present

## 2020-11-22 MED ORDER — RIZATRIPTAN BENZOATE 10 MG PO TABS: 10.0000 mg | ORAL_TABLET | ORAL | 0 refills | Status: DC | PRN

## 2020-11-22 MED ORDER — PREDNISONE 10 MG PO TABS: 10.0000 mg | ORAL_TABLET | ORAL | 0 refills | Status: AC

## 2020-11-22 NOTE — Progress Notes (Signed)
ce   Date:  11/22/2020   Name:  Beverly Thomas   DOB:  March 12, 1981   MRN:  [id number]   Chief Complaint: eye swelling under eye (X1 week, left eye swollen, red, itching, right arm was itching, red spots)  Eye Problem  The left (under left eye) eye is affected. This is a new problem. Associated symptoms include nausea and photophobia. Pertinent negatives include no eye redness, fever, itching or vomiting. Treatments tried: benedryl. The treatment provided moderate relief.  Migraine  This is a chronic problem. Episode frequency: about 2-3 times per month. The pain quality is similar to prior headaches. The quality of the pain is described as pulsating and throbbing. The pain is moderate. Associated symptoms include nausea, phonophobia and photophobia. Pertinent negatives include no dizziness, eye redness, fever, numbness, seizures or vomiting. She has tried acetaminophen and NSAIDs for the symptoms. The treatment provided mild relief.  Rash This is a new problem. The current episode started in the past 7 days. The affected locations include the right arm. The rash is characterized by swelling, itchiness and redness. Pertinent negatives include no fatigue, fever, shortness of breath or vomiting.    Lab Results  Component Value Date   CREATININE 0.73 06/03/2020   BUN 10 06/03/2020   NA 139 06/03/2020   K 4.0 06/03/2020   CL 103 06/03/2020   CO2 22 06/03/2020   Lab Results  Component Value Date   CHOL 222 (H) 06/03/2020   HDL 71 06/03/2020   LDLCALC 138 (H) 06/03/2020   TRIG 77 06/03/2020   CHOLHDL 3.1 06/03/2020   Lab Results  Component Value Date   TSH 0.877 06/03/2020   No results found for: HGBA1C Lab Results  Component Value Date   WBC 4.7 06/03/2020   HGB 12.6 06/03/2020   HCT 38.7 06/03/2020   MCV 85 06/03/2020   PLT 293 06/03/2020   Lab Results  Component Value Date   ALT 6 06/03/2020   AST 17 06/03/2020   ALKPHOS 95 06/03/2020   BILITOT 0.7 06/03/2020      Review of Systems  Constitutional: Negative for chills, fatigue and fever.  Eyes: Positive for photophobia. Negative for redness and itching.       Redness and itching of skin under the left eye  Respiratory: Negative for chest tightness, shortness of breath and wheezing.   Cardiovascular: Negative for chest pain.  Gastrointestinal: Positive for nausea. Negative for vomiting.  Skin: Positive for rash.  Neurological: Positive for headaches. Negative for dizziness, seizures, syncope and numbness.    Patient Active Problem List   Diagnosis Date Noted  . Elevated blood pressure reading 06/03/2020  . 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 Obesity (BMI 30.0-34.9) 06/15/2018  . Cyst, sebaceous, breast 06/29/2016  . H/O abnormal cervical Papanicolaou smear 05/27/2015  . Environmental and seasonal allergies 05/27/2015  . 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 Asthma, mild intermittent 05/27/2015  . Back muscle spasm 05/27/2015  . Avitaminosis D 05/27/2015    Allergies  Allergen Reactions  . Diflucan [Fluconazole] Swelling    Lips swell  . Metronidazole Other (See Comments)    Other reaction(s): Angioedema  . Sulfa Antibiotics Hives  . Zyrtec [Cetirizine] Hives  . Diazepam Other (See Comments)    Other reaction(s): pruritis  . Latex Rash  . Milk-Related Compounds Nausea And Vomiting    Past Surgical History:  Procedure Laterality Date  . BUNIONECTOMY Bilateral 2013, 2014  . COLPOSCOPY  2011  Social History   Tobacco Use  . Smoking status: Never Smoker  . Smokeless tobacco: Never Used  Vaping Use  . Vaping Use: Never used  Substance Use Topics  . Alcohol use: Not Currently    Alcohol/week: 0.0 standard drinks    Comment: social  . Drug use: No     Medication list has been reviewed and updated.  Current Meds  Medication Sig  . albuterol (ACCUNEB) 1.25 MG/3ML nebulizer solution Take 3 mLs (1.25 mg total) by nebulization  every 6 (six) hours as needed for wheezing.  Marland Kitchen albuterol (PROAIR HFA) 108 (90 Base) MCG/ACT inhaler Inhale 2 puffs into the lungs every 6 (six) hours as needed for wheezing or shortness of breath.  . citalopram (CELEXA) 40 MG tablet Take 40 mg by mouth daily.  . fluticasone (FLOVENT HFA) 44 MCG/ACT inhaler Inhale 2 puffs into the lungs daily.  . Fluticasone-Umeclidin-Vilant (TRELEGY ELLIPTA) 100-62.5-25 MCG/INH AEPB Inhale 1 puff into the lungs daily.  Marland Kitchen guaiFENesin-codeine (ROBITUSSIN AC) 100-10 MG/5ML syrup Take 5 mLs by mouth 3 (three) times daily as needed for cough.  Marland Kitchen ibuprofen (ADVIL) 600 MG tablet Take 1 tablet (600 mg total) by mouth every 6 (six) hours.  Marland Kitchen levocetirizine (XYZAL) 5 MG tablet Take 1 tablet (5 mg total) by mouth every evening.  . montelukast (SINGULAIR) 10 MG tablet Take 1 tablet (10 mg total) by mouth at bedtime.  . Vitamin D, Ergocalciferol, (DRISDOL) 1.25 MG (50000 UNIT) CAPS capsule Take 50,000 Units by mouth 2 (two) times a week.   Current Facility-Administered Medications for the 11/22/20 encounter (Office Visit) with Reubin Milan, MD  Medication  . ipratropium-albuterol (DUONEB) 0.5-2.5 (3) MG/3ML nebulizer solution 3 mL    PHQ 2/9 Scores 11/22/2020 06/03/2020 02/14/2020 12/15/2017  PHQ - 2 Score 0 2 6 0  PHQ- 9 Score 0 5 17 0    GAD 7 : Generalized Anxiety Score 11/22/2020 06/03/2020 06/01/2016  Nervous, Anxious, on Edge 2 1 3   Control/stop worrying 2 2 3   Worry too much - different things 2 2 3   Trouble relaxing 0 1 3  Restless 0 1 3  Easily annoyed or irritable 0 3 3  Afraid - awful might happen 0 0 3  Total GAD 7 Score 6 10 21   Anxiety Difficulty - Somewhat difficult -    BP Readings from Last 3 Encounters:  11/22/20 130/88  08/09/20 124/88  06/03/20 (!) 142/84    Physical Exam Vitals and nursing note reviewed.  Constitutional:      General: She is not in acute distress.    Appearance: She is well-developed.  HENT:     Head: Normocephalic  and atraumatic.      Comments: Red raised smooth lesion Pulmonary:     Effort: Pulmonary effort is normal. No respiratory distress.  Musculoskeletal:        General: Normal range of motion.  Skin:    General: Skin is warm and dry.     Findings: No rash.       Neurological:     General: No focal deficit present.     Mental Status: She is alert and oriented to person, place, and time.     Cranial Nerves: Cranial nerves are intact.     Motor: Motor function is intact.     Deep Tendon Reflexes: Reflexes are normal and symmetric.  Psychiatric:        Behavior: Behavior normal.        Thought Content: Thought content  normal.     Wt Readings from Last 3 Encounters:  11/22/20 218 lb (98.9 kg)  08/22/20 223 lb (101.2 kg)  08/09/20 226 lb (102.5 kg)    BP 130/88   Pulse 81   Temp 98.7 F (37.1 C) (Oral)   Ht 5\' 7"  (1.702 m)   Wt 218 lb (98.9 kg)   SpO2 95%   BMI 34.14 kg/m   Assessment and Plan: 1. Periorbital cellulitis of left eye Continue topical cortisone and oral benadryl for itching - predniSONE (DELTASONE) 10 MG tablet; Take 1 tablet (10 mg total) by mouth as directed for 6 days. Take 6,5,4,3,2,1 then stop  Dispense: 21 tablet; Refill: 0  2. Migraine without status migrainosus, not intractable, unspecified migraine type Recurrence since son was born - not responding to otc medication Will give Maxalt trial - call for refills if beneficial - rizatriptan (MAXALT) 10 MG tablet; Take 1 tablet (10 mg total) by mouth as needed for migraine. May repeat in 2 hours if needed  Dispense: 10 tablet; Refill: 0  3. Insect bite of right forearm, initial encounter Local care only - no evidence of bacterial infection   Partially dictated using . Any errors are unintentional.  Animal nutritionist, MD [facility]  11/22/2020

## 2020-12-02 ENCOUNTER — Ambulatory Visit: Payer: Medicaid Other | Admitting: Internal Medicine

## 2021-08-15 ENCOUNTER — Other Ambulatory Visit: Payer: Self-pay | Admitting: Internal Medicine

## 2021-08-15 DIAGNOSIS — J4520 Mild intermittent asthma, uncomplicated: Secondary | ICD-10-CM

## 2021-08-15 NOTE — Telephone Encounter (Signed)
Requested medication (s) are due for refill today: Yes  Requested medication (s) are on the active medication list: Yes  Last refill:  06/03/20  Future visit scheduled: No  Notes to clinic:  Prescription expired.    Requested Prescriptions  Pending Prescriptions Disp Refills   montelukast (SINGULAIR) 10 MG tablet [Pharmacy Med Name: MONTELUKAST 10MG  TABLETS] 90 tablet 1    Sig: TAKE 1 TABLET BY MOUTH AT BEDTIME     Pulmonology:  Leukotriene Inhibitors Passed - 08/15/2021  8:55 AM      Passed - Valid encounter within last 12 months    Recent Outpatient Visits           8 months ago Periorbital cellulitis of left eye   [facility], MD   11 months ago Mild intermittent asthma without complication   [facility], MD   1 year ago Asthma exacerbation, mild   [facility] Reubin Milan, MD   1 year ago Annual physical exam   [facility], MD   1 year ago Alopecia   [facility], MD

## 2022-02-02 ENCOUNTER — Other Ambulatory Visit: Payer: Self-pay | Admitting: Internal Medicine

## 2022-02-02 DIAGNOSIS — J4521 Mild intermittent asthma with (acute) exacerbation: Secondary | ICD-10-CM

## 2022-02-03 NOTE — Telephone Encounter (Signed)
Requested medication (s) are due for refill today: yes  Requested medication (s) are on the active medication list: yes  Last refill:  08/09/2020  Future visit scheduled: no, last visit 08/22/2020  Notes to clinic:  Failed protocol due to no valid visit within 12  months, no upcoming appt scheduled,please assess.   Requested Prescriptions  Pending Prescriptions Disp Refills   albuterol (VENTOLIN HFA) 108 (90 Base) MCG/ACT inhaler [Pharmacy Med Name: ALBUTEROL HFA INH (200 PUFFS)8.5GM] 8.5 g     Sig: INHALE 2 PUFFS INTO THE LUNGS EVERY 6 HOURS AS NEEDED FOR WHEEZING OR SHORTNESS OF BREATH     Pulmonology:  Beta Agonists 2 Failed - 02/02/2022  9:06 AM      Failed - Valid encounter within last 12 months    Recent Outpatient Visits           1 year ago Periorbital cellulitis of left eye   [facility] Glean Hess, MD   1 year ago Mild intermittent asthma without complication   [facility] Glean Hess, MD   1 year ago Asthma exacerbation, mild   [facility] Glean Hess, MD   1 year ago Annual physical exam   [facility] Glean Hess, MD   1 year ago [facility] Glean Hess, MD              Passed - Last BP in normal range    BP Readings from Last 1 Encounters:  11/22/20 130/88          Passed - Last Heart Rate in normal range    Pulse Readings from Last 1 Encounters:  11/22/20 81

## 2024-05-02 ENCOUNTER — Ambulatory Visit (INDEPENDENT_AMBULATORY_CARE_PROVIDER_SITE_OTHER): Admitting: Student

## 2024-05-02 ENCOUNTER — Encounter: Payer: Self-pay | Admitting: Student

## 2024-05-02 VITALS — BP 132/88 | Ht 67.0 in | Wt 156.8 lb

## 2024-05-02 DIAGNOSIS — E559 Vitamin D deficiency, unspecified: Secondary | ICD-10-CM | POA: Diagnosis not present

## 2024-05-02 DIAGNOSIS — F1020 Alcohol dependence, uncomplicated: Secondary | ICD-10-CM | POA: Diagnosis not present

## 2024-05-02 DIAGNOSIS — J4520 Mild intermittent asthma, uncomplicated: Secondary | ICD-10-CM | POA: Diagnosis not present

## 2024-05-02 DIAGNOSIS — F331 Major depressive disorder, recurrent, moderate: Secondary | ICD-10-CM | POA: Insufficient documentation

## 2024-05-02 MED ORDER — NALTREXONE HCL 50 MG PO TABS: 50.0000 mg | ORAL_TABLET | Freq: Every day | ORAL | 2 refills | Status: AC

## 2024-05-02 MED ORDER — VENLAFAXINE HCL ER 37.5 MG PO CP24: 37.5000 mg | ORAL_CAPSULE | Freq: Every day | ORAL | 2 refills | Status: AC

## 2024-05-02 NOTE — Patient Instructions (Addendum)
 Please look into [facility] in [place] for alcohol use resources  [email or web address]  Please try the CBT-I Coach app for insomnia

## 2024-05-02 NOTE — Progress Notes (Signed)
 New Patient Office Visit  Subjective    Patient ID: Beverly Thomas, female    DOB: [birth year]  Age: 43 y.o. MRN: [id number]  CC:  Chief Complaint  Patient presents with   Establish Care    HPI VERDENE Thomas is a 47 year ol person with history of MDD, anxiety, mild intermittent asthma, and allergies presents to establish care  Depression and anxiety Reports increase work responsibilities. She works as a Psychologist, prison and probation services. Currently looking for a therapist. Was on venlafaxine but has run out due to change in insurance. Has been on celexa  in the past but made her feel numb. Reports affect is flat, black and white thinking, insomnia due to ruminating thoughts. Decreased interest in hobbies and social events, and irritability. She lives with son who is 4 and daughter who is 28. Denies SI, HI, AVD.   Asthma  Using albuterol  1-2 times a month, take allegra  for allergies and montelukast . Used to live with partner who smoked and symptoms are much better now that she is not exposed to second hand smoke. Denies cough, wheezing, or shortness of breath.   Outpatient Encounter Medications as of 05/02/2024  Medication Sig   albuterol  (ACCUNEB ) 1.25 MG/3ML nebulizer solution Take 3 mLs (1.25 mg total) by nebulization every 6 (six) hours as needed for wheezing.   albuterol  (PROAIR  HFA) 108 (90 Base) MCG/ACT inhaler Inhale 2 puffs into the lungs every 6 (six) hours as needed for wheezing or shortness of breath.   fluticasone  (FLONASE ) 50 MCG/ACT nasal spray Place 1 spray into the nose.   fluticasone  (FLOVENT  HFA) 44 MCG/ACT inhaler Inhale 2 puffs into the lungs daily.   montelukast  (SINGULAIR ) 10 MG tablet Take 1 tablet (10 mg total) by mouth at bedtime.   naltrexone (DEPADE) 50 MG tablet Take 1 tablet (50 mg total) by mouth daily.   [DISCONTINUED] Fluticasone -Umeclidin-Vilant (TRELEGY ELLIPTA ) 100-62.5-25 MCG/INH AEPB Inhale 1 puff into the lungs daily.   [DISCONTINUED] ibuprofen  (ADVIL ) 600 MG tablet  Take 1 tablet (600 mg total) by mouth every 6 (six) hours.   [DISCONTINUED] levocetirizine (XYZAL ) 5 MG tablet Take 1 tablet (5 mg total) by mouth every evening.   [DISCONTINUED] venlafaxine XR (EFFEXOR-XR) 37.5 MG 24 hr capsule Take 37.5 mg by mouth daily with breakfast.   venlafaxine XR (EFFEXOR-XR) 37.5 MG 24 hr capsule Take 1 capsule (37.5 mg total) by mouth daily with breakfast.   Vitamin D, Ergocalciferol, (DRISDOL) 1.25 MG (50000 UNIT) CAPS capsule Take 50,000 Units by mouth 2 (two) times a week. (Patient not taking: Reported on 05/02/2024)   [DISCONTINUED] citalopram  (CELEXA ) 40 MG tablet Take 40 mg by mouth daily. (Patient not taking: Reported on 05/02/2024)   [DISCONTINUED] guaiFENesin -codeine  (ROBITUSSIN AC) 100-10 MG/5ML syrup Take 5 mLs by mouth 3 (three) times daily as needed for cough.   [DISCONTINUED] rizatriptan  (MAXALT ) 10 MG tablet Take 1 tablet (10 mg total) by mouth as needed for migraine. May repeat in 2 hours if needed (Patient not taking: Reported on 05/02/2024)   [DISCONTINUED] ipratropium-albuterol  (DUONEB) 0.5-2.5 (3) MG/3ML nebulizer solution 3 mL    No facility-administered encounter medications on file as of 05/02/2024.    Past Medical History:  Diagnosis Date   Allergy    Asthma    Depression    Headache    Vaginal Pap smear, abnormal     Past Surgical History:  Procedure Laterality Date   BUNIONECTOMY Bilateral 2013, 2014   COLPOSCOPY  2011    Family History  Problem  Relation Age of Onset   Diabetes Mother    Hypertension Mother     Social History   Socioeconomic History   Marital status: Single    Spouse name: Not on file   Number of children: 2   Years of education: Not on file   Highest education level: Not on file  Occupational History   Not on file  Tobacco Use   Smoking status: Never   Smokeless tobacco: Never  Vaping Use   Vaping status: Never Used  Substance and Sexual Activity   Alcohol use: Yes    Comment: whiskey, drinking  everyday   Drug use: No   Sexual activity: Not Currently  Other Topics Concern   Not on file  Social History Narrative   Not on file   Social Drivers of Health   Financial Resource Strain: Low Risk  (06/05/2019)   Overall Financial Resource Strain (CARDIA)    Difficulty of Paying Living Expenses: Not hard at all  Food Insecurity: No Food Insecurity (06/05/2019)   Hunger Vital Sign    Worried About Running Out of Food in the Last Year: Never true    Ran Out of Food in the Last Year: Never true  Transportation Needs: Unknown (06/05/2019)   PRAPARE - Administrator, Civil Service (Medical): No    Lack of Transportation (Non-Medical): Not on file  Physical Activity: Not on file  Stress: Stress Concern Present (06/05/2019)   [employer] of Occupational Health - Occupational Stress Questionnaire    Feeling of Stress : To some extent  Social Connections: Not on file  Intimate Partner Violence: Not At Risk (06/05/2019)   Humiliation, Afraid, Rape, and Kick questionnaire    Fear of Current or Ex-Partner: No    Emotionally Abused: No    Physically Abused: No    Sexually Abused: No    ROS Refer to HPI    Objective   BP 132/88 (BP Location: Left Arm, Patient Position: Sitting, Cuff Size: Normal)   Ht 5\' 7"  (1.702 m)   Wt 156 lb 12.8 oz (71.1 kg)   LMP  (LMP Unknown)   BMI 24.56 kg/m   Physical Exam Constitutional:      Appearance: Normal appearance.  HENT:     Mouth/Throat:     Mouth: Mucous membranes are moist.     Pharynx: Oropharynx is clear.  Eyes:     General: No scleral icterus.    Extraocular Movements: Extraocular movements intact.     Conjunctiva/sclera: Conjunctivae normal.     Pupils: Pupils are equal, round, and reactive to light.  Cardiovascular:     Rate and Rhythm: Normal rate and regular rhythm.     Pulses: Normal pulses.     Heart sounds: No murmur heard. Pulmonary:     Effort: Pulmonary effort is normal.     Breath sounds: No rhonchi  or rales.  Abdominal:     General: Abdomen is flat. Bowel sounds are normal. There is no distension.     Palpations: Abdomen is soft.     Tenderness: There is no abdominal tenderness.  Musculoskeletal:        General: Normal range of motion.     Right lower leg: No edema.     Left lower leg: No edema.  Skin:    General: Skin is warm and dry.     Capillary Refill: Capillary refill takes less than 2 seconds.     Coloration: Skin is not jaundiced.  Findings: No rash.  Neurological:     General: No focal deficit present.     Mental Status: She is alert and oriented to person, place, and time.  Psychiatric:        Behavior: Behavior normal.        Thought Content: Thought content normal.        Judgment: Judgment normal.     Comments: Flat affect, intermittently tearful        11/22/2020    1:49 PM 06/03/2020    9:12 AM 02/14/2020    1:57 PM  Depression screen PHQ 2/9  Decreased Interest 0 1 3  Down, Depressed, Hopeless 0 1 3  PHQ - 2 Score 0 2 6  Altered sleeping 0 0 3  Tired, decreased energy 0 1 3  Change in appetite 0 0 2  Feeling bad or failure about yourself  0 1 1  Trouble concentrating 0 1 2  Moving slowly or fidgety/restless 0 0 0  Suicidal thoughts 0 0 0  PHQ-9 Score 0 5 17  Difficult doing work/chores  Somewhat difficult Somewhat difficult      11/22/2020    1:49 PM 06/03/2020    9:13 AM 06/01/2016    1:36 PM  GAD 7 : Generalized Anxiety Score  Nervous, Anxious, on Edge 2 1 3   Control/stop worrying 2 2 3   Worry too much - different things 2 2 3   Trouble relaxing 0 1 3  Restless 0 1 3  Easily annoyed or irritable 0 3 3  Afraid - awful might happen 0 0 3  Total GAD 7 Score 6 10 21   Anxiety Difficulty  Somewhat difficult     Last CBC Lab Results  Component Value Date   WBC 4.7 06/03/2020   HGB 12.6 06/03/2020   HCT 38.7 06/03/2020   MCV 85 06/03/2020   MCH 27.6 06/03/2020   RDW 13.3 06/03/2020   PLT 293 06/03/2020   Last metabolic panel Lab  Results  Component Value Date   GLUCOSE 84 06/03/2020   NA 139 06/03/2020   K 4.0 06/03/2020   CL 103 06/03/2020   CO2 22 06/03/2020   BUN 10 06/03/2020   CREATININE 0.73 06/03/2020   GFRNONAA 105 06/03/2020   CALCIUM  8.9 06/03/2020   PROT 6.8 06/03/2020   ALBUMIN 4.3 06/03/2020   LABGLOB 2.5 06/03/2020   AGRATIO 1.7 06/03/2020   BILITOT 0.7 06/03/2020   ALKPHOS 95 06/03/2020   AST 17 06/03/2020   ALT 6 06/03/2020   ANIONGAP 7 12/31/2018   Last lipids Lab Results  Component Value Date   CHOL 222 (H) 06/03/2020   HDL 71 06/03/2020   LDLCALC 138 (H) 06/03/2020   TRIG 77 06/03/2020   CHOLHDL 3.1 06/03/2020   Last hemoglobin A1c No results found for: "HGBA1C" Last thyroid  functions Lab Results  Component Value Date   TSH 0.877 06/03/2020    Assessment & Plan:  Alcohol use disorder, moderate, dependence (HCC) Assessment & Plan: Reports drinking whiskey daily and more heavily on weekends. Drinks alcohol due to increase increased stress due to work and life responsibilities. Declined to mention how much drinks in a day, she does not drink at work or driving. Reports drinking is affecting finances, has strong cravings at end of work day, and avoids social gatherings or events due to desire to drink alcohol. Know she is drinking more than is healthy but finds it hard to stop. No history DTs. She would like to have a healthier  relationship with drinking. Discussed she cut down on drinking slowly. Discussed evaluation at [facility] as she is interested in substance use treatment. Will check CBC and CMP. No stigmata of chronic liver disease on exam. Will haver her start naltrexone 50 mg daily for cravings. Follow up in 1 month.   Orders: -     Comprehensive metabolic panel with GFR -     Naltrexone HCl; Take 1 tablet (50 mg total) by mouth daily.  Dispense: 30 tablet; Refill: 2 -     CBC  Moderate episode of recurrent major depressive disorder (HCC) Assessment & Plan: Previously on  venlafaxine but stopped due to changing insurance. Not currently seeing psychiatry. PHQ 9 17 today, does feel venlafaxine worked well in the past. Will restart venlafaxine 37.5 mg daily, Discussed CBT-I app for insomnia.   Orders: -     Venlafaxine HCl ER; Take 1 capsule (37.5 mg total) by mouth daily with breakfast.  Dispense: 30 capsule; Refill: 2 -     TSH  Mild intermittent asthma without complication Assessment & Plan: Previously on flovent  and albuterol . Also on montelukast . Now only using albuterol  1-2 times monthly. Tends to have flares in spring and around Thanksgiving. Symptoms are much better after her ex who smoked moved out. Given lack of flare will have her continue albuterol  as needed and stop flovent . Given increased depression and anxiety will have her discontinue montelukast .   Avitaminosis D Assessment & Plan: Not currently on supplementation, check vitamin D level today  Orders: -     VITAMIN D 25 Hydroxy (Vit-D Deficiency, Fractures)    Return in about 4 weeks (around 05/30/2024) for mood.   Barnetta Liberty, MD

## 2024-05-02 NOTE — Assessment & Plan Note (Addendum)
 Previously on flovent  and albuterol . Also on montelukast . Now only using albuterol  1-2 times monthly. Tends to have flares in spring and around Thanksgiving. Symptoms are much better after her ex who smoked moved out. Given lack of flare will have her continue albuterol  as needed and stop flovent . Given increased depression and anxiety will have her discontinue montelukast .

## 2024-05-02 NOTE — Assessment & Plan Note (Deleted)
 Previously on venlafaxine but stopped due to changing insurance. Not currently seeing psychiatry. PHQ 9 17 today, does feel venlafaxine worked well in the past. Will restart venlafaxine 37.5 mg daily, Discussed CBT-I app for insomnia.

## 2024-05-02 NOTE — Assessment & Plan Note (Signed)
 Previously on venlafaxine but stopped due to changing insurance. Not currently seeing psychiatry. PHQ 9 17 today, does feel venlafaxine worked well in the past. Will restart venlafaxine 37.5 mg daily, Discussed CBT-I app for insomnia.

## 2024-05-02 NOTE — Assessment & Plan Note (Signed)
 Reports drinking whiskey daily and more heavily on weekends. Drinks alcohol due to increase increased stress due to work and life responsibilities. Declined to mention how much drinks in a day, she does not drink at work or driving. Reports drinking is affecting finances, has strong cravings at end of work day, and avoids social gatherings or events due to desire to drink alcohol. Know she is drinking more than is healthy but finds it hard to stop. No history DTs. She would like to have a healthier relationship with drinking. Discussed she cut down on drinking slowly. Discussed evaluation at [facility] as she is interested in substance use treatment. Will check CBC and CMP. No stigmata of chronic liver disease on exam. Will haver her start naltrexone 50 mg daily for cravings. Follow up in 1 month.

## 2024-05-02 NOTE — Assessment & Plan Note (Signed)
 Not currently on supplementation, check vitamin D level today

## 2024-05-02 NOTE — Progress Notes (Deleted)
 New Patient Office Visit  Subjective    Patient ID: Beverly Thomas, female    DOB: 21-Aug-1981  Age: 43 y.o. MRN: [id number]  CC:  Chief Complaint  Patient presents with  . Establish Care    HPI Beverly Thomas presents to establish care ***  Outpatient Encounter Medications as of 05/02/2024  Medication Sig  . albuterol  (ACCUNEB ) 1.25 MG/3ML nebulizer solution Take 3 mLs (1.25 mg total) by nebulization every 6 (six) hours as needed for wheezing.  . albuterol  (PROAIR  HFA) 108 (90 Base) MCG/ACT inhaler Inhale 2 puffs into the lungs every 6 (six) hours as needed for wheezing or shortness of breath.  . fluticasone  (FLONASE ) 50 MCG/ACT nasal spray Place 1 spray into the nose.  . fluticasone  (FLOVENT  HFA) 44 MCG/ACT inhaler Inhale 2 puffs into the lungs daily.  . Fluticasone -Umeclidin-Vilant (TRELEGY ELLIPTA ) 100-62.5-25 MCG/INH AEPB Inhale 1 puff into the lungs daily.  . ibuprofen  (ADVIL ) 600 MG tablet Take 1 tablet (600 mg total) by mouth every 6 (six) hours.  Aaron Aas levocetirizine (XYZAL ) 5 MG tablet Take 1 tablet (5 mg total) by mouth every evening.  . montelukast  (SINGULAIR ) 10 MG tablet Take 1 tablet (10 mg total) by mouth at bedtime.  Aaron Aas venlafaxine XR (EFFEXOR-XR) 37.5 MG 24 hr capsule Take 37.5 mg by mouth daily with breakfast.  . citalopram  (CELEXA ) 40 MG tablet Take 40 mg by mouth daily. (Patient not taking: Reported on 05/02/2024)  . rizatriptan  (MAXALT ) 10 MG tablet Take 1 tablet (10 mg total) by mouth as needed for migraine. May repeat in 2 hours if needed (Patient not taking: Reported on 05/02/2024)  . Vitamin D, Ergocalciferol, (DRISDOL) 1.25 MG (50000 UNIT) CAPS capsule Take 50,000 Units by mouth 2 (two) times a week. (Patient not taking: Reported on 05/02/2024)  . [DISCONTINUED] guaiFENesin -codeine  (ROBITUSSIN AC) 100-10 MG/5ML syrup Take 5 mLs by mouth 3 (three) times daily as needed for cough.   Facility-Administered Encounter Medications as of 05/02/2024  Medication  .  ipratropium-albuterol  (DUONEB) 0.5-2.5 (3) MG/3ML nebulizer solution 3 mL    Past Medical History:  Diagnosis Date  . Allergy   . Asthma   . Depression   . Headache   . Vaginal Pap smear, abnormal     Past Surgical History:  Procedure Laterality Date  . BUNIONECTOMY Bilateral 2013, 2014  . COLPOSCOPY  2011    Family History  Problem Relation Age of Onset  . Diabetes Mother   . Hypertension Mother     Social History   Socioeconomic History  . Marital status: Single    Spouse name: Not on file  . Number of children: 2  . Years of education: Not on file  . Highest education level: Not on file  Occupational History  . Not on file  Tobacco Use  . Smoking status: Never  . Smokeless tobacco: Never  Vaping Use  . Vaping status: Never Used  Substance and Sexual Activity  . Alcohol use: Not Currently    Alcohol/week: 0.0 standard drinks of alcohol    Comment: social  . Drug use: No  . Sexual activity: Not Currently  Other Topics Concern  . Not on file  Social History Narrative  . Not on file   Social Drivers of Health   Financial Resource Strain: Low Risk  (06/05/2019)   Overall Financial Resource Strain (CARDIA)   . Difficulty of Paying Living Expenses: Not hard at all  Food Insecurity: No Food Insecurity (06/05/2019)   Hunger Vital Sign   .  Worried About Programme researcher, broadcasting/film/video in the Last Year: Never true   . Ran Out of Food in the Last Year: Never true  Transportation Needs: Unknown (06/05/2019)   PRAPARE - Transportation   . Lack of Transportation (Medical): No   . Lack of Transportation (Non-Medical): Not on file  Physical Activity: Not on file  Stress: Stress Concern Present (06/05/2019)   [employer] of Occupational Health - Occupational Stress Questionnaire   . Feeling of Stress : To some extent  Social Connections: Not on file  Intimate Partner Violence: Not At Risk (06/05/2019)   Humiliation, Afraid, Rape, and Kick questionnaire   . Fear of Current  or Ex-Partner: No   . Emotionally Abused: No   . Physically Abused: No   . Sexually Abused: No    ROS Refer to HPI    Objective   BP 132/88 (BP Location: Left Arm, Patient Position: Sitting, Cuff Size: Normal)   Ht 5\' 7"  (1.702 m)   Wt 156 lb 12.8 oz (71.1 kg)   LMP  (LMP Unknown)   BMI 24.56 kg/m   Physical Exam     11/22/2020    1:49 PM 06/03/2020    9:12 AM 02/14/2020    1:57 PM  Depression screen PHQ 2/9  Decreased Interest 0 1 3  Down, Depressed, Hopeless 0 1 3  PHQ - 2 Score 0 2 6  Altered sleeping 0 0 3  Tired, decreased energy 0 1 3  Change in appetite 0 0 2  Feeling bad or failure about yourself  0 1 1  Trouble concentrating 0 1 2  Moving slowly or fidgety/restless 0 0 0  Suicidal thoughts 0 0 0  PHQ-9 Score 0 5 17  Difficult doing work/chores  Somewhat difficult Somewhat difficult      11/22/2020    1:49 PM 06/03/2020    9:13 AM 06/01/2016    1:36 PM  GAD 7 : Generalized Anxiety Score  Nervous, Anxious, on Edge 2 1 3   Control/stop worrying 2 2 3   Worry too much - different things 2 2 3   Trouble relaxing 0 1 3  Restless 0 1 3  Easily annoyed or irritable 0 3 3  Afraid - awful might happen 0 0 3  Total GAD 7 Score 6 10 21   Anxiety Difficulty  Somewhat difficult     {Labs (Optional):23779}    Assessment & Plan:  There are no diagnoses linked to this encounter.  No follow-ups on file.   Barnetta Liberty, MD

## 2024-09-07 ENCOUNTER — Ambulatory Visit: Admitting: Nurse Practitioner
